# Patient Record
Sex: Male | Born: 1950 | Race: Black or African American | Hispanic: No | Marital: Married | Smoking: Current every day smoker
Health system: Southern US, Community
[De-identification: ages and names within clinical notes are randomized; demographics above are authoritative.]

## PROBLEM LIST (undated history)

## (undated) DIAGNOSIS — M51369 Other intervertebral disc degeneration, lumbar region without mention of lumbar back pain or lower extremity pain: Secondary | ICD-10-CM

## (undated) DIAGNOSIS — F419 Anxiety disorder, unspecified: Secondary | ICD-10-CM

## (undated) DIAGNOSIS — E119 Type 2 diabetes mellitus without complications: Secondary | ICD-10-CM

## (undated) DIAGNOSIS — M5136 Other intervertebral disc degeneration, lumbar region: Secondary | ICD-10-CM

## (undated) DIAGNOSIS — K219 Gastro-esophageal reflux disease without esophagitis: Secondary | ICD-10-CM

## (undated) DIAGNOSIS — C801 Malignant (primary) neoplasm, unspecified: Secondary | ICD-10-CM

## (undated) DIAGNOSIS — E785 Hyperlipidemia, unspecified: Secondary | ICD-10-CM

## (undated) HISTORY — DX: Other intervertebral disc degeneration, lumbar region without mention of lumbar back pain or lower extremity pain: M51.369

## (undated) HISTORY — DX: Gastro-esophageal reflux disease without esophagitis: K21.9

## (undated) HISTORY — DX: Hyperlipidemia, unspecified: E78.5

## (undated) HISTORY — DX: Malignant (primary) neoplasm, unspecified: C80.1

## (undated) HISTORY — DX: Other intervertebral disc degeneration, lumbar region: M51.36

## (undated) HISTORY — DX: Type 2 diabetes mellitus without complications: E11.9

## (undated) HISTORY — DX: Anxiety disorder, unspecified: F41.9

## (undated) HISTORY — PX: PROSTATE SURGERY: SHX751

---

## 1999-10-15 ENCOUNTER — Encounter (INDEPENDENT_AMBULATORY_CARE_PROVIDER_SITE_OTHER): Payer: Self-pay

## 1999-10-15 ENCOUNTER — Other Ambulatory Visit: Admission: RE | Admit: 1999-10-15 | Discharge: 1999-10-15 | Payer: Self-pay | Admitting: Gastroenterology

## 2002-03-23 ENCOUNTER — Encounter: Admission: RE | Admit: 2002-03-23 | Discharge: 2002-03-23 | Payer: Self-pay | Admitting: Family Medicine

## 2002-03-23 ENCOUNTER — Encounter: Payer: Self-pay | Admitting: Family Medicine

## 2003-02-03 ENCOUNTER — Ambulatory Visit (HOSPITAL_COMMUNITY): Admission: RE | Admit: 2003-02-03 | Discharge: 2003-02-03 | Payer: Self-pay | Admitting: Urology

## 2003-02-21 ENCOUNTER — Ambulatory Visit: Admission: RE | Admit: 2003-02-21 | Discharge: 2003-04-26 | Payer: Self-pay | Admitting: Radiation Oncology

## 2003-04-06 ENCOUNTER — Inpatient Hospital Stay (HOSPITAL_COMMUNITY): Admission: RE | Admit: 2003-04-06 | Discharge: 2003-04-09 | Payer: Self-pay | Admitting: Urology

## 2003-04-06 ENCOUNTER — Encounter (INDEPENDENT_AMBULATORY_CARE_PROVIDER_SITE_OTHER): Payer: Self-pay | Admitting: Specialist

## 2004-01-30 ENCOUNTER — Emergency Department (HOSPITAL_COMMUNITY): Admission: EM | Admit: 2004-01-30 | Discharge: 2004-01-30 | Payer: Self-pay | Admitting: Emergency Medicine

## 2005-05-21 ENCOUNTER — Ambulatory Visit: Admission: RE | Admit: 2005-05-21 | Discharge: 2005-08-01 | Payer: Self-pay | Admitting: Radiation Oncology

## 2009-01-20 ENCOUNTER — Encounter (HOSPITAL_COMMUNITY): Admission: RE | Admit: 2009-01-20 | Discharge: 2009-04-19 | Payer: Self-pay | Admitting: Urology

## 2010-05-25 NOTE — Discharge Summary (Signed)
NAMECLEMENS, [name]                        ACCOUNT NO.:  [id number]   MEDICAL RECORD NO.:  [id number]                   PATIENT TYPE:  INP   LOCATION:  0353                                 FACILITY:  [facility]   PHYSICIAN:  Lucrezia Starch. Ovidio Hanger, M.D.           DATE OF BIRTH:  1951/01/04   DATE OF ADMISSION:  04/06/2003  DATE OF DISCHARGE:  04/09/2003                                 DISCHARGE SUMMARY   DIAGNOSIS:  Adenocarcinoma of the prostate.   OPERATIVE PROCEDURE:  Radical retropubic prostatectomy April 06, 2003.   HISTORY:  [name] is a very nice 60 year old black male who presented  with elevated PSA and strong family history of prostate cancer, his PSA was  found to be 6.02, he subsequently underwent transrectal ultrasound and  biopsy of the prostate which revealed a Gleason Score 7 which was 3+4  adenocarcinoma involving cores on both the right and left side, his  metastatic workup has been negative.  After considering options, he has  elected to proceed with radical prostatectomy.   PAST MEDICAL HISTORY, SOCIAL HISTORY, FAMILY HISTORY, REVIEW OF SYSTEMS:  Please see signed patient medical history sheet for full details.   PHYSICAL EXAMINATION:  VITALS:  He is afebrile, vital signs stable.  GENERAL:  Well-nourished, well-developed; no acute distress.  HEAD/EARS/NOSE AND THROAT:  Normal.  NECK:  Without mass or thyromegaly.  CHEST:  Normal.  ABDOMEN:  Soft, nontender, without masses, organomegaly, or hernias.  EXTREMITIES:  Normal.  NEUROLOGICAL:  Intact.  SKIN:  Normal.  GU/RECTAL:  Penis, meatus, scrotum, testicles, adnexa, anus and perineum are  normal.  Rectal vault shows prostate is 30 g, smooth, without nodules.   HOSPITAL COURSE:  Patient was admitted after undergoing proper preoperative  evaluation and was subsequently taken to surgery on April 06, 2003 and  underwent radical retropubic prostatectomy uneventfully.  In the immediate  postoperative period he  was comfortable, urine was clear, and abdomen was  soft.  On postop day #1 which was April 07, 2003 hemoglobin was 10.9,  hematocrit 33.3, white cell count was 14.6, BMET was essentially normal.  He  was comfortable and beginning to take fluids.  He had some soft abdominal  distention but was comfortable.  By April 08, 2003 I&O was adequate, he was  tolerating liquids without flatus or bowel movements, chest was clear,  abdomen soft, Blake output was variable but low and the morphine PCA and  Toradol were discontinued.  He subsequently improved and by April 09, 2003  was tolerating a diet, a bowel movement was noted, abdomen was slightly  distended but less so and nontender, Blake output was low so it was  discontinued, wound was clean and dry  and he was discharged, it was felt the patient was improved.  Discharge  medications included Levaquin, Vicodin, and aspirin.  He was to follow up in  the next week for staple removal.  Specific instructions  were given.  Pathology was pending at that time.                                               Ronald L. Ovidio Hanger, M.D.    RLD/MEDQ  D:  04/13/2003  T:  04/14/2003  Job:  [id number]

## 2010-05-25 NOTE — H&P (Signed)
[name], [name]                        ACCOUNT NO.:  [id number]   MEDICAL RECORD NO.:  [id number]                   PATIENT TYPE:  INP   LOCATION:  0353                                 FACILITY:  [facility]   PHYSICIAN:  [name], M.D.               DATE OF BIRTH:  1950-11-18   DATE OF ADMISSION:  04/06/2003  DATE OF DISCHARGE:                                HISTORY & PHYSICAL   CHIEF COMPLAINT:  Prostate cancer.   HISTORY OF PRESENT ILLNESS:  [name] is a pleasant 60 year old, African-  American male, who has been followed in the past for an elevated PSA.  In  addition, he has a strong family history of prostate cancer with prostate  cancer in his father and his brothers.  On a routine PSA check, his PSA  level was found to be 6.02.  Subsequently, he underwent a transrectal  ultrasound-guided prostate biopsy which revealed a Gleason 3 + 4 = 7  adenocarcinoma of the prostate involving cores on both the right and the  left.  A CT scan was then performed with and without contrast which showed  no evidence of metastatic disease or lymphadenopathy.  The patient was  subsequently evaluated by radiation oncology, and several methods of therapy  were discussed including radiation seed implantation, cryosurgery, watchful  waiting, and radical retropubic prostatectomy.  The patient has elected to  proceed with surgical prostatectomy for definitive management of his  prostate cancer.  He is admitted at this time to undergo a radical  retropubic prostatectomy with bilateral pelvic lymph node dissection.   PAST MEDICAL HISTORY:  1. Anxiety.  2. Hypercholesterolemia.  3. Gastroesophageal reflux disease.   PAST SURGICAL HISTORY:  None.   FAMILY HISTORY:  As previously mentioned, positive for prostate cancer in  the patient's brothers as well as his father.   SOCIAL HISTORY:  The patient is a 10-pack-year smoker.  He denies any  alcohol or illicit drug abuse.   REVIEW OF  SYSTEMS:  Positive for occasional headaches as well as fatigue.  Also positive is occasional shortness of breath, urinary frequency, and  urinary urgency.  The remainder of review of systems were reviewed and are  negative.   MEDICATIONS:  1. Xanax 0.25 mg t.i.d.  2. Paxil CR 12.5 mg daily.  3. Lipitor 20 mg p.o. daily.  4. Aciphex 20 mg p.o. daily.  5. Bayer aspirin 81 mg p.o. daily.   ALLERGIES:  No known drug allergies.   PHYSICAL EXAMINATION:  VITAL SIGNS:  Blood pressure 125/75, heart rate 73,  respirations 20, temperature 98.1.  GENERAL:  Well-developed, well-nourished, in no apparent distress.  HEENT:  Normocephalic, atraumatic.  Pupils equal, round, and reactive to  light and accommodation.  Extraocular movements are intact.  Nares are  patent.  The oropharynx is clear.  NECK:  Supple.  No lymphadenopathy.  There is no JVD.  CARDIOVASCULAR:  Regular rate  and rhythm.  No murmurs, gallops, or rubs.  PULMONARY:  Clear to auscultation bilaterally.  No wheezes, crackles, or  rhonchi.  ABDOMEN:  Soft, nontender, nondistended with positive bowel sounds.  No  hepatosplenomegaly.  BACK:  No CVA tenderness bilaterally.  EXTREMITIES:  No cyanosis, clubbing, or edema.  NEUROLOGIC:  Cranial nerves 2-12 intact.  Sensation is grossly nonfocal.  Motor is 5/5 throughout.  SKIN:  Integument intact.  GU:  Normal-appearing, circumcised male phallus.  There are no palpable  penile plaques, and the urethral meatus is normal without discharge.  The  scrotum is normal in appearance.  The testicles are bilaterally distended  without evidence of masses or nodules.  DIGITAL RECTAL EXAM:  Good sphincter tone.  The prostate is approximately 30  g, smooth, symmetric without nodules.   IMPRESSION:  This is a very pleasant 60 year old African-American male found  to have a Gleason 3 + 4 = 7 adenocarcinoma of the prostate bilaterally with  a pretreatment PSA of 6.02.  In addition, he has a very strong  family  history of prostate cancer as well as some lower urinary tract symptoms  including frequency and urgency.  After discussing several modalities of  definitive treatment, the patient has elected to proceed with radical  retropubic prostatectomy.   PLAN:  1. The patient will undergo radical retropubic prostatectomy with bilateral     pelvic lymph node dissection.  2. He will be admitted to the floor postoperatively for observation and     advancement of diet prior to discharge to home.     Tracy Breed, MD                            [name], M.D.    EG/MEDQ  D:  04/06/2003  T:  04/06/2003  Job:  [id number]

## 2010-05-25 NOTE — Op Note (Signed)
NAMEJACHAI, [name]                        ACCOUNT NO.:  [id number]   MEDICAL RECORD NO.:  [id number]                   PATIENT TYPE:  INP   LOCATION:  0353                                 FACILITY:  [facility]   PHYSICIAN:  Lucrezia Starch. Earlene Plater, M.D.               DATE OF BIRTH:  11/05/1950   DATE OF PROCEDURE:  04/06/2003  DATE OF DISCHARGE:                                 OPERATIVE REPORT   PREOPERATIVE DIAGNOSIS:  Prostate cancer.   POSTOPERATIVE DIAGNOSIS:  Prostate cancer.   PROCEDURE:  Nerve sparing radical retropubic prostatectomy with bilateral  pelvic lymph node dissection.   SURGEON:  Lucrezia Starch. Earlene Plater, MD   ASSISTANTS:  1. Larey Dresser, MD  2. Thyra Breed, MD   ANESTHESIA:  General endotracheal.   ESTIMATED BLOOD LOSS:  550 mL.   DRAINS:  A 22 French Foley catheter to straight drain and a 10 [place] Blake  drain to bulb suction.   COMPLICATIONS:  None.   INDICATIONS FOR PROCEDURE:  [name] is a very pleasant, 60 year old  African-American male, who underwent a transrectal ultrasound-guided  prostate biopsy for an elevated PSA of 6.02.  His pathology returned with a  Gleason 3 + 4 = 7 adenocarcinoma of the prostate involving both sides of the  prostate.  In addition, he has a strong family history of prostate cancer in  his father as well as his brothers.  Additionally, he has some lower urinary  tract symptoms, including urinary urgency and frequency.  After exploring  all modalities of treatment for his prostate cancer, he has elected to  proceed with surgery.  Therefore, the risks, benefits, and alternatives of  nerve sparing radical retropubic prostatectomy have been explained to the  patient.  He understands and is willing to proceed.   PROCEDURE IN DETAIL:  Following identification by his arm bracelet, the  patient was brought to the operating room and placed in the supine position.  He then underwent general endotracheal anesthesia and received  preoperative  IV antibiotics.  The lower abdomen and genitalia were then shaved, prepped  with Betadine, and draped in the usual sterile fashion.  A 24 French Foley  catheter was then inserted into the bladder and the bladder drained.  A  lower midline incision was then made using the scalpel from just below the  umbilicus to the level of the pubis.  The incision was carried down through  the subcutaneous tissues to the anterior rectus fascia using Bovie  electrocautery.  Any subcutaneous bleeding was controlled using the Bovie.  The fascia was then divided using the Bovie and the rectus muscle  identified.  The midline was then isolated and the rectus muscle divided by  blunt dissection.  The transversalis fascia was then opened and blunt  dissection was used to expose both the right and the left pelvic fossa.  The  Bookwalter retractor was then inserted, and two body wall  blades were used  to provide maximum exposure of the operative field.  The right pelvic fossa  was then exposed with the addition of malleable retractor.  Right-sided  lymph node dissection was then performed using Metzenbaum scissors and  DeBakey forceps.  The limits of the node dissection included the external  iliac vein, the obturator nerve, the circumflex iliac vein to the level of  the bifurcation of the iliac artery.  Large clips were used to control any  vascular and lymphatic channels.  The obturator nerve was in direct site and  protected during the entire procedure.  There was a rather large nodal  packet on the right but no obvious gross nodal disease.  We then turned our  attention after shifting retractors to the left pelvic fossa.  The left-  sided node dissection was then carried out in a similar manner, again  controlling all lymphatic vascular channels with large clips.  Once the  nodal dissections were complete and there was no obvious gross nodal  disease, we began our dissection of the prostate.  It  should be noted that  the pelvis was rather narrow, and the ensuing dissection of the prostate was  somewhat difficult.  Upon beginning our dissection of the prostate, it was  quite obvious that the patient had a somewhat narrow pelvis with a difficult  to visualize prostate.  Initially, Metzenbaum scissors were used to puncture  the endopelvic fascia on both the right and the left lateral aspects of the  prostate.  A right-angle clamp was then inserted through the opening and the  lateral aspect of the prostate was freed using Bovie electrocautery.  The  fascia was then incised superiorly as it reflected over the prostate.  Fat  overlying the dorsal vein complex was then gently moved away to narrow the  area.  The puboprostatic ligaments were then taken down from their  attachments to the pubis laterally.  The McDougall clamp was then placed  just superior to the urethra beneath the dorsal vein complex.  A #1 Vicryl  tie was then passed and used to ligate the dorsal vein complex.  A second #1  Vicryl tie was also used to doubly ligate this complex.  We then utilized a  2-0 Vicryl suture on a UR-5 needle to prevent backbleeding.  The dorsal vein  complex was then divided exposing the apex of the prostate and the urethra.  At this time, a second 2-0 Vicryl suture was used in a running fashion to  prevent any further backbleeding from the dorsal vein complex.  We then  carefully moved the neurovascular bundles from the urethra laterally,  spreading with Metzenbaum scissors in a vertical fashion in between the  urethra and the neurovascular bundles.  The McDougall clamp was then again  utilized and passed beneath the urethra.  A moistened umbilical tape was  passed and a hemostat affixed it from the operative field.  The anterior  urethra was then divided sharply with Metzenbaum scissors at the apex of the  prostate.  The Foley catheter was then lubricated and pulled into the wound. The  catheter was divided and used to provide traction on the prostate during  further dissection.  The posterior urethra was then divided and all  rectourethralis attachments were taken down bluntly.  The remainder of the  prostate was then dissected off the rectum bluntly.  The lateral pedicles  were then taken down using right-angle dissection and large right-angle Hem-  o-Lok  clips.  Once the prostate was sufficiently elevated, we turned our  attention anteriorly to the location of the bladder neck.  The Bovie was  then used to begin dissection of the bladder neck fibers.  The anterior  bladder neck was then divided with Metzenbaum scissors.  The Foley catheter  balloon was then deflated and the catheter brought from the bladder and used  to provide traction on the prostate.  At this time, both ureteral orifices  were identified and seen to be effluxing blue urine from earlier  administration of indigo carmine.  The posterior bladder neck was then  divided along with any remaining posterior prostatic attachments.  We  continued this dissection until the seminal vesicles were isolated.  Both  ampulla of the vas were then identified, dissected out, and ligated using  large clips.  Similarly, the seminal vesicles were dissected out and ligated  using large clips.  The prostate was then free and passed from the surgical  field for pathologic evaluation.   At this time, any remaining bleeding was controlled with Bovie  electrocautery.  Once excellent hemostasis was obtained, we turned our  attention to the bladder neck.  The bladder neck was quite nice in  appearance without requiring any reconstruction.  The bladder neck just fit  the surgeon's fifth finger.  We then used interrupted 4-0 chromic suture to  evert the bladder mucosa and mature the bladder neck prior to anastomosis to  the urethral stump.  Care was taken throughout this portion of the procedure  to avoid the ureteral orifices.  At  this point, a new 22 [place] Foley  catheter was inserted into the penis and DeBakey forceps used to retrieve it  from its exit through the urethral stump.  At this point, the anastomotic  sutures were placed; 2-0 Vicryl interrupted sutures were then placed in the  following order:  Beginning with the bladder from outside-in to inside-out  on the urethral stump, interrupted sutures were placed in the 5 o'clock, 7  o'clock, 10 o'clock, and 2 o'clock positions.  The Foley catheter was then  inserted into the bladder and approximately 15 mL used to fill the balloon.  At this time, the 12 o'clock anastomotic suture was placed with a 2-0 Vicryl  suture.  At this time, the anastomotic sutures were then secured into  position.  However, upon tying down the first anastomotic suture, the Foley  catheter balloon was found to be outside of the bladder.  The posterior  portion of the bladder neck was seen to be weak and allowed the Foley catheter balloon to pull through the bladder neck during this portion of the  procedure.  Therefore, the Foley catheter was removed; all anastomotic  sutures were removed, and we reconstructed the bladder neck in a tennis  racquet fashion using a 3-0 interrupted chromic suture.  Two additional  interrupted 4-0 chromic sutures were then used to complete bladder neck  eversion.  Again, 2-0 Vicryl sutures were placed in the 5, 7, 2, 10, and 12  o'clock positions as previously stated.  A new Foley catheter balloon was  inserted into the bladder.  It should be mentioned that a 1-0 Prolene suture  was affixed to the end of the Foley catheter and carefully brought through  the dome of the bladder for further fixation to the right abdominal wall.  All anastomotic sutures were then secured after removing all flecks from the  table and loosening all Bookwalter retractors.  The Foley catheter was then  irrigated, and the anastomosis was seen to be watertight.  The wound was  then  copiously irrigated.  The previously mentioned 1-0 PDS exiting the  bladder was then passed through the right anterior lateral abdominal wall,  well lateral to the incision and secured to a button.  On the left abdominal  wall lateral to the incision, a #10 flat fluted Blake drain was placed  through a separate stab wound and placed in the vicinity of the anastomosis.  One final check revealed excellent hemostasis and an intact anastomosis.  A  #1 running PDS suture was then used to close the fascia.  The rectus muscles  were then reapproximated with two interrupted 2-0 chromic sutures.  A #1  running PDS suture was then used to close the fascia.  The subcutaneous  tissues were then irrigated with sterile saline.  Following closure of the  fascia, the skin was then closed with surgical clips.  The Foley catheter  was again irrigated.  There was immediate return of pink-tinged urine.  All  instrument and needle counts were correct x 2.  This marked the termination  of the procedure.  The patient tolerated the procedure well, and there were  no complications.  Please note that Dr. Gaynelle Arabian was present and  participated in the entire procedure as he was the responsible surgeon.   DISPOSITION:  After awaking from general anesthesia, the patient was  transported to the postanesthesia care unit in stable condition.  From here,  he would be transferred to the floor for postoperative care.     Thyra Breed, MD                            Lucrezia Starch. Earlene Plater, M.D.    EG/MEDQ  D:  04/06/2003  T:  04/06/2003  Job:  [id number]

## 2011-10-21 ENCOUNTER — Encounter: Payer: Self-pay | Admitting: Gastroenterology

## 2012-05-11 ENCOUNTER — Encounter: Payer: Self-pay | Admitting: Gastroenterology

## 2012-10-28 ENCOUNTER — Other Ambulatory Visit: Payer: Self-pay | Admitting: Physician Assistant

## 2012-10-28 ENCOUNTER — Ambulatory Visit
Admission: RE | Admit: 2012-10-28 | Discharge: 2012-10-28 | Disposition: A | Discharge status: Home / Self Care | Payer: PRIVATE HEALTH INSURANCE | Source: Ambulatory Visit | Attending: Physician Assistant | Admitting: Physician Assistant

## 2012-10-28 DIAGNOSIS — F17200 Nicotine dependence, unspecified, uncomplicated: Secondary | ICD-10-CM

## 2012-10-28 DIAGNOSIS — R05 Cough: Secondary | ICD-10-CM

## 2013-10-30 ENCOUNTER — Ambulatory Visit (INDEPENDENT_AMBULATORY_CARE_PROVIDER_SITE_OTHER): Payer: Managed Care, Other (non HMO) | Admitting: Family Medicine

## 2013-10-30 VITALS — BP 130/74 | HR 79 | Temp 98.2°F | Resp 19 | Ht 67.5 in | Wt 204.8 lb

## 2013-10-30 DIAGNOSIS — R05 Cough: Secondary | ICD-10-CM

## 2013-10-30 DIAGNOSIS — J309 Allergic rhinitis, unspecified: Secondary | ICD-10-CM

## 2013-10-30 DIAGNOSIS — J3489 Other specified disorders of nose and nasal sinuses: Secondary | ICD-10-CM

## 2013-10-30 DIAGNOSIS — R059 Cough, unspecified: Secondary | ICD-10-CM

## 2013-10-30 DIAGNOSIS — J069 Acute upper respiratory infection, unspecified: Secondary | ICD-10-CM

## 2013-10-30 MED ORDER — AMOXICILLIN-POT CLAVULANATE 875-125 MG PO TABS: 1.0000 | ORAL_TABLET | Freq: Two times a day (BID) | ORAL | Status: DC

## 2013-10-30 NOTE — Patient Instructions (Signed)
Your symptoms appear to be due to likely cold virus or allergies at this time. Saline nasal spray atleast 4 times per day, over the counter mucinex or mucinex for cough. Avoid decongestants. drink plenty of fluids.  If your sinus symptoms are not improving next week, can fill antibiotic for sinus infection. Ok to start flonase nasal spray over the counter if needed for allergies.  Return to the clinic or go to the nearest emergency room if any of your symptoms worsen or new symptoms occur.  Upper Respiratory Infection, Adult An upper respiratory infection (URI) is also sometimes known as the common cold. The upper respiratory tract includes the nose, sinuses, throat, trachea, and bronchi. Bronchi are the airways leading to the lungs. Most people improve within 1 week, but symptoms can last up to 2 weeks. A residual cough may last even longer.  CAUSES Many different viruses can infect the tissues lining the upper respiratory tract. The tissues become irritated and inflamed and often become very moist. Mucus production is also common. A cold is contagious. You can easily spread the virus to others by oral contact. This includes kissing, sharing a glass, coughing, or sneezing. Touching your mouth or nose and then touching a surface, which is then touched by another person, can also spread the virus. SYMPTOMS  Symptoms typically develop 1 to 3 days after you come in contact with a cold virus. Symptoms vary from person to person. They may include:  Runny nose.  Sneezing.  Nasal congestion.  Sinus irritation.  Sore throat.  Loss of voice (laryngitis).  Cough.  Fatigue.  Muscle aches.  Loss of appetite.  Headache.  Low-grade fever. DIAGNOSIS  You might diagnose your own cold based on familiar symptoms, since most people get a cold 2 to 3 times a year. Your caregiver can confirm this based on your exam. Most importantly, your caregiver can check that your symptoms are not due to another  disease such as strep throat, sinusitis, pneumonia, asthma, or epiglottitis. Blood tests, throat tests, and X-rays are not necessary to diagnose a common cold, but they may sometimes be helpful in excluding other more serious diseases. Your caregiver will decide if any further tests are required. RISKS AND COMPLICATIONS  You may be at risk for a more severe case of the common cold if you smoke cigarettes, have chronic heart disease (such as heart failure) or lung disease (such as asthma), or if you have a weakened immune system. The very young and very old are also at risk for more serious infections. Bacterial sinusitis, middle ear infections, and bacterial pneumonia can complicate the common cold. The common cold can worsen asthma and chronic obstructive pulmonary disease (COPD). Sometimes, these complications can require emergency medical care and may be life-threatening. PREVENTION  The best way to protect against getting a cold is to practice good hygiene. Avoid oral or hand contact with people with cold symptoms. Wash your hands often if contact occurs. There is no clear evidence that vitamin C, vitamin E, echinacea, or exercise reduces the chance of developing a cold. However, it is always recommended to get plenty of rest and practice good nutrition. TREATMENT  Treatment is directed at relieving symptoms. There is no cure. Antibiotics are not effective, because the infection is caused by a virus, not by bacteria. Treatment may include:  Increased fluid intake. Sports drinks offer valuable electrolytes, sugars, and fluids.  Breathing heated mist or steam (vaporizer or shower).  Eating chicken soup or other clear broths,  and maintaining good nutrition.  Getting plenty of rest.  Using gargles or lozenges for comfort.  Controlling fevers with ibuprofen or acetaminophen as directed by your caregiver.  Increasing usage of your inhaler if you have asthma. Zinc gel and zinc lozenges, taken in  the first 24 hours of the common cold, can shorten the duration and lessen the severity of symptoms. Pain medicines may help with fever, muscle aches, and throat pain. A variety of non-prescription medicines are available to treat congestion and runny nose. Your caregiver can make recommendations and may suggest nasal or lung inhalers for other symptoms.  HOME CARE INSTRUCTIONS   Only take over-the-counter or prescription medicines for pain, discomfort, or fever as directed by your caregiver.  Use a warm mist humidifier or inhale steam from a shower to increase air moisture. This may keep secretions moist and make it easier to breathe.  Drink enough water and fluids to keep your urine clear or pale yellow.  Rest as needed.  Return to work when your temperature has returned to normal or as your caregiver advises. You may need to stay home longer to avoid infecting others. You can also use a face mask and careful hand washing to prevent spread of the virus. SEEK MEDICAL CARE IF:   After the first few days, you feel you are getting worse rather than better.  You need your caregiver's advice about medicines to control symptoms.  You develop chills, worsening shortness of breath, or brown or red sputum. These may be signs of pneumonia.  You develop yellow or brown nasal discharge or pain in the face, especially when you bend forward. These may be signs of sinusitis.  You develop a fever, swollen neck glands, pain with swallowing, or white areas in the back of your throat. These may be signs of strep throat. SEEK IMMEDIATE MEDICAL CARE IF:   You have a fever.  You develop severe or persistent headache, ear pain, sinus pain, or chest pain.  You develop wheezing, a prolonged cough, cough up blood, or have a change in your usual mucus (if you have chronic lung disease).  You develop sore muscles or a stiff neck. Document Released: 06/19/2000 Document Revised: 03/18/2011 Document Reviewed:  03/31/2013 [facility] Patient Information 2015 [place], [place]. This information is not intended to replace advice given to you by your health care provider. Make sure you discuss any questions you have with your health care provider.

## 2013-10-30 NOTE — Progress Notes (Signed)
Subjective:    Patient ID: [name], male    DOB: 1950/05/03, 63 y.o.   MRN: [id number]  Headache  Associated symptoms include coughing and rhinorrhea. Pertinent negatives include no fever or sinus pressure.  Sore Throat  Associated symptoms include congestion, coughing, headaches and a plugged ear sensation. Pertinent negatives include no shortness of breath.  Ear Fullness  Associated symptoms include coughing, headaches and rhinorrhea.   Chief Complaint  Patient presents with  . Nasal Congestion    clear drain, x 2 days  . Headache    facial pressure  . Sore Throat  . Ear Fullness   This chart was scribed for Merri Ray, MD by Thea Alken, ED Scribe. This patient was seen in room 13 and the patient's care was started at 10:44 AM.  HPI Comments: [name] is a 63 y.o. male who presents to the [facility] complaining of nasal congestion, HA and facial pressure. Pt reports symptoms began 3 days ago with sinus drainage and a scratchy throat that progressively worsened over night with HA, upper face tightness, diaphoresis, sneezing, watery eyes and a  productive cough consisting of clear mucous. Pt has tried tylenol cold and sinus and took a mucinex last night without relief.Pt reports similar symptoms about 1 year ago where he received 2 rounds. Pt takes certirizine for allergies.  Pt denies sick contacts. Pt denies SOB and fever.    There are no active problems to display for this patient.  Past Medical History  Diagnosis Date  . Anxiety   . Cancer    Past Surgical History  Procedure Laterality Date  . Prostate surgery     No Known Allergies Prior to Admission medications   Medication Sig Start Date End Date Taking? Authorizing Provider  ALPRAZolam Duanne Moron) 0.5 MG tablet Take 0.5 mg by mouth at bedtime as needed for anxiety.   Yes Historical Provider, MD  aspirin 81 MG tablet Take 81 mg by mouth daily.   Yes Historical Provider, MD    atorvastatin (LIPITOR) 10 MG tablet Take 10 mg by mouth daily.   Yes Historical Provider, MD  cetirizine (ZYRTEC) 10 MG tablet Take 10 mg by mouth daily.   Yes Historical Provider, MD  cyclobenzaprine (FLEXERIL) 10 MG tablet Take 10 mg by mouth 3 (three) times daily as needed for muscle spasms.   Yes Historical Provider, MD  enalapril (VASOTEC) 2.5 MG tablet Take 2.5 mg by mouth daily.   Yes Historical Provider, MD  metFORMIN (GLUCOPHAGE) 500 MG tablet Take 500 mg by mouth 2 (two) times daily with a meal.   Yes Historical Provider, MD  RABEprazole (ACIPHEX) 20 MG tablet Take 20 mg by mouth daily.   Yes Historical Provider, MD   History   Social History  . Marital Status: Married    Spouse Name: N/A    Number of Children: N/A  . Years of Education: N/A   Occupational History  . Not on file.   Social History Main Topics  . Smoking status: Current Every Day Smoker  . Smokeless tobacco: Never Used  . Alcohol Use: No  . Drug Use: No  . Sexual Activity: Not on file   Other Topics Concern  . Not on file   Social History Narrative  . No narrative on file   Review of Systems  Constitutional: Positive for diaphoresis. Negative for fever and chills.  HENT: Positive for congestion, rhinorrhea and sneezing. Negative for sinus pressure.  Eyes: Positive for discharge.  Respiratory: Positive for cough. Negative for shortness of breath.   Neurological: Positive for headaches.    Objective:   Physical Exam  Vitals reviewed. Constitutional: He is oriented to person, place, and time. He appears well-developed and well-nourished.  HENT:  Head: Normocephalic and atraumatic.  Right Ear: Tympanic membrane, external ear and ear canal normal.  Left Ear: Tympanic membrane, external ear and ear canal normal.  Nose: No rhinorrhea. Right sinus exhibits no maxillary sinus tenderness and no frontal sinus tenderness. Left sinus exhibits no maxillary sinus tenderness and no frontal sinus tenderness.   Mouth/Throat: Oropharynx is clear and moist and mucous membranes are normal. No oropharyngeal exudate or posterior oropharyngeal erythema.  TM's obstructed by cerumen but canals are clear otherwise.  Eyes: Conjunctivae are normal. Pupils are equal, round, and reactive to light.  Neck: Neck supple.  Cardiovascular: Regular rhythm, normal heart sounds and intact distal pulses.   No murmur heard. Rear ectopic beat other wise rhythm normal  Pulmonary/Chest: Effort normal and breath sounds normal. He has no decreased breath sounds. He has no wheezes. He has no rhonchi. He has no rales.  Abdominal: Soft. There is no tenderness.  Lymphadenopathy:    He has no cervical adenopathy.  Neurological: He is alert and oriented to person, place, and time.  Skin: Skin is warm and dry. No rash noted.  Psychiatric: He has a normal mood and affect. His behavior is normal.   Filed Vitals:   10/30/13 1007  BP: 130/74  Pulse: 79  Temp: 98.2 F (36.8 C)  TempSrc: Oral  Resp: 19  Height: 5' 7.5" (1.715 m)  Weight: 204 lb 12.8 oz (92.897 kg)  SpO2: 97%    Assessment & Plan:   [name] is a 64 y.o. male Acute upper respiratory infection - Plan: amoxicillin-clavulanate (AUGMENTIN) 875-125 MG per tablet  Allergic rhinitis, unspecified allergic rhinitis type  Cough  Sinus pressure - Plan: amoxicillin-clavulanate (AUGMENTIN) 875-125 MG per tablet  Suspected viral URI, +/- allergy component. Discussed likely not bacterial process at this time, even with sinus pressure. Sx care for now per avs, rtc precautions and if sinus sx's not improving next week - printed Rx augmentin to fill if needed. Rtc/er precautions.   Meds ordered this encounter  Medications  . metFORMIN (GLUCOPHAGE) 500 MG tablet    Sig: Take 500 mg by mouth 2 (two) times daily with a meal.  . RABEprazole (ACIPHEX) 20 MG tablet    Sig: Take 20 mg by mouth daily.  Marland Kitchen atorvastatin (LIPITOR) 10 MG tablet    Sig: Take 10 mg by mouth  daily.  . cetirizine (ZYRTEC) 10 MG tablet    Sig: Take 10 mg by mouth daily.  Marland Kitchen ALPRAZolam (XANAX) 0.5 MG tablet    Sig: Take 0.5 mg by mouth at bedtime as needed for anxiety.  Marland Kitchen aspirin 81 MG tablet    Sig: Take 81 mg by mouth daily.  . enalapril (VASOTEC) 2.5 MG tablet    Sig: Take 2.5 mg by mouth daily.  . cyclobenzaprine (FLEXERIL) 10 MG tablet    Sig: Take 10 mg by mouth 3 (three) times daily as needed for muscle spasms.  Marland Kitchen amoxicillin-clavulanate (AUGMENTIN) 875-125 MG per tablet    Sig: Take 1 tablet by mouth 2 (two) times daily.    Dispense:  20 tablet    Refill:  0   Patient Instructions  Your symptoms appear to be due to likely cold virus or allergies  at this time. Saline nasal spray atleast 4 times per day, over the counter mucinex or mucinex for cough. Avoid decongestants. drink plenty of fluids.  If your sinus symptoms are not improving next week, can fill antibiotic for sinus infection. Ok to start flonase nasal spray over the counter if needed for allergies.  Return to the clinic or go to the nearest emergency room if any of your symptoms worsen or new symptoms occur.  Upper Respiratory Infection, Adult An upper respiratory infection (URI) is also sometimes known as the common cold. The upper respiratory tract includes the nose, sinuses, throat, trachea, and bronchi. Bronchi are the airways leading to the lungs. Most people improve within 1 week, but symptoms can last up to 2 weeks. A residual cough may last even longer.  CAUSES Many different viruses can infect the tissues lining the upper respiratory tract. The tissues become irritated and inflamed and often become very moist. Mucus production is also common. A cold is contagious. You can easily spread the virus to others by oral contact. This includes kissing, sharing a glass, coughing, or sneezing. Touching your mouth or nose and then touching a surface, which is then touched by another person, can also spread the  virus. SYMPTOMS  Symptoms typically develop 1 to 3 days after you come in contact with a cold virus. Symptoms vary from person to person. They may include:  Runny nose.  Sneezing.  Nasal congestion.  Sinus irritation.  Sore throat.  Loss of voice (laryngitis).  Cough.  Fatigue.  Muscle aches.  Loss of appetite.  Headache.  Low-grade fever. DIAGNOSIS  You might diagnose your own cold based on familiar symptoms, since most people get a cold 2 to 3 times a year. Your caregiver can confirm this based on your exam. Most importantly, your caregiver can check that your symptoms are not due to another disease such as strep throat, sinusitis, pneumonia, asthma, or epiglottitis. Blood tests, throat tests, and X-rays are not necessary to diagnose a common cold, but they may sometimes be helpful in excluding other more serious diseases. Your caregiver will decide if any further tests are required. RISKS AND COMPLICATIONS  You may be at risk for a more severe case of the common cold if you smoke cigarettes, have chronic heart disease (such as heart failure) or lung disease (such as asthma), or if you have a weakened immune system. The very young and very old are also at risk for more serious infections. Bacterial sinusitis, middle ear infections, and bacterial pneumonia can complicate the common cold. The common cold can worsen asthma and chronic obstructive pulmonary disease (COPD). Sometimes, these complications can require emergency medical care and may be life-threatening. PREVENTION  The best way to protect against getting a cold is to practice good hygiene. Avoid oral or hand contact with people with cold symptoms. Wash your hands often if contact occurs. There is no clear evidence that vitamin C, vitamin E, echinacea, or exercise reduces the chance of developing a cold. However, it is always recommended to get plenty of rest and practice good nutrition. TREATMENT  Treatment is directed at  relieving symptoms. There is no cure. Antibiotics are not effective, because the infection is caused by a virus, not by bacteria. Treatment may include:  Increased fluid intake. Sports drinks offer valuable electrolytes, sugars, and fluids.  Breathing heated mist or steam (vaporizer or shower).  Eating chicken soup or other clear broths, and maintaining good nutrition.  Getting plenty of rest.  Using  gargles or lozenges for comfort.  Controlling fevers with ibuprofen or acetaminophen as directed by your caregiver.  Increasing usage of your inhaler if you have asthma. Zinc gel and zinc lozenges, taken in the first 24 hours of the common cold, can shorten the duration and lessen the severity of symptoms. Pain medicines may help with fever, muscle aches, and throat pain. A variety of non-prescription medicines are available to treat congestion and runny nose. Your caregiver can make recommendations and may suggest nasal or lung inhalers for other symptoms.  HOME CARE INSTRUCTIONS   Only take over-the-counter or prescription medicines for pain, discomfort, or fever as directed by your caregiver.  Use a warm mist humidifier or inhale steam from a shower to increase air moisture. This may keep secretions moist and make it easier to breathe.  Drink enough water and fluids to keep your urine clear or pale yellow.  Rest as needed.  Return to work when your temperature has returned to normal or as your caregiver advises. You may need to stay home longer to avoid infecting others. You can also use a face mask and careful hand washing to prevent spread of the virus. SEEK MEDICAL CARE IF:   After the first few days, you feel you are getting worse rather than better.  You need your caregiver's advice about medicines to control symptoms.  You develop chills, worsening shortness of breath, or brown or red sputum. These may be signs of pneumonia.  You develop yellow or brown nasal discharge or pain  in the face, especially when you bend forward. These may be signs of sinusitis.  You develop a fever, swollen neck glands, pain with swallowing, or white areas in the back of your throat. These may be signs of strep throat. SEEK IMMEDIATE MEDICAL CARE IF:   You have a fever.  You develop severe or persistent headache, ear pain, sinus pain, or chest pain.  You develop wheezing, a prolonged cough, cough up blood, or have a change in your usual mucus (if you have chronic lung disease).  You develop sore muscles or a stiff neck. Document Released: 06/19/2000 Document Revised: 03/18/2011 Document Reviewed: 03/31/2013 [facility] Patient Information 2015 [place], [place]. This information is not intended to replace advice given to you by your health care provider. Make sure you discuss any questions you have with your health care provider.     I personally performed the services described in this documentation, which was scribed in my presence. The recorded information has been reviewed and considered, and addended by me as needed.

## 2014-01-06 DIAGNOSIS — IMO0002 Reserved for concepts with insufficient information to code with codable children: Secondary | ICD-10-CM | POA: Insufficient documentation

## 2014-01-06 DIAGNOSIS — E1165 Type 2 diabetes mellitus with hyperglycemia: Secondary | ICD-10-CM

## 2014-01-06 DIAGNOSIS — E785 Hyperlipidemia, unspecified: Secondary | ICD-10-CM

## 2014-01-06 DIAGNOSIS — E782 Mixed hyperlipidemia: Secondary | ICD-10-CM | POA: Insufficient documentation

## 2014-01-13 ENCOUNTER — Encounter: Payer: Self-pay | Admitting: *Deleted

## 2017-04-17 ENCOUNTER — Emergency Department (HOSPITAL_COMMUNITY)
Admission: EM | Admit: 2017-04-17 | Discharge: 2017-04-17 | Disposition: A | Discharge status: Home / Self Care | Payer: Medicare Other | Attending: Emergency Medicine | Admitting: Emergency Medicine

## 2017-04-17 ENCOUNTER — Encounter (HOSPITAL_COMMUNITY): Payer: Self-pay | Admitting: Emergency Medicine

## 2017-04-17 DIAGNOSIS — Z79899 Other long term (current) drug therapy: Secondary | ICD-10-CM | POA: Diagnosis not present

## 2017-04-17 DIAGNOSIS — M79602 Pain in left arm: Secondary | ICD-10-CM | POA: Diagnosis present

## 2017-04-17 DIAGNOSIS — F17200 Nicotine dependence, unspecified, uncomplicated: Secondary | ICD-10-CM | POA: Insufficient documentation

## 2017-04-17 DIAGNOSIS — E119 Type 2 diabetes mellitus without complications: Secondary | ICD-10-CM | POA: Diagnosis not present

## 2017-04-17 DIAGNOSIS — Z7984 Long term (current) use of oral hypoglycemic drugs: Secondary | ICD-10-CM | POA: Insufficient documentation

## 2017-04-17 DIAGNOSIS — K219 Gastro-esophageal reflux disease without esophagitis: Secondary | ICD-10-CM | POA: Diagnosis not present

## 2017-04-17 DIAGNOSIS — Z7982 Long term (current) use of aspirin: Secondary | ICD-10-CM | POA: Insufficient documentation

## 2017-04-17 LAB — CBC WITH DIFFERENTIAL/PLATELET
BASOS ABS: 0 10*3/uL (ref 0.0–0.1)
BASOS PCT: 0 %
EOS PCT: 2 %
Eosinophils Absolute: 0.2 10*3/uL (ref 0.0–0.7)
HCT: 40.6 % (ref 39.0–52.0)
Hemoglobin: 13.3 g/dL (ref 13.0–17.0)
Lymphocytes Relative: 44 %
Lymphs Abs: 3.4 10*3/uL (ref 0.7–4.0)
MCH: 27.4 pg (ref 26.0–34.0)
MCHC: 32.8 g/dL (ref 30.0–36.0)
MCV: 83.7 fL (ref 78.0–100.0)
MONO ABS: 0.5 10*3/uL (ref 0.1–1.0)
Monocytes Relative: 7 %
Neutro Abs: 3.8 10*3/uL (ref 1.7–7.7)
Neutrophils Relative %: 47 %
PLATELETS: 253 10*3/uL (ref 150–400)
RBC: 4.85 MIL/uL (ref 4.22–5.81)
RDW: 13.6 % (ref 11.5–15.5)
WBC: 7.9 10*3/uL (ref 4.0–10.5)

## 2017-04-17 LAB — COMPREHENSIVE METABOLIC PANEL
ALBUMIN: 4.4 g/dL (ref 3.5–5.0)
ALT: 46 U/L (ref 17–63)
AST: 32 U/L (ref 15–41)
Alkaline Phosphatase: 61 U/L (ref 38–126)
Anion gap: 13 (ref 5–15)
BUN: 9 mg/dL (ref 6–20)
CO2: 25 mmol/L (ref 22–32)
Calcium: 9.6 mg/dL (ref 8.9–10.3)
Chloride: 102 mmol/L (ref 101–111)
Creatinine, Ser: 1.02 mg/dL (ref 0.61–1.24)
GFR calc Af Amer: >60 mL/min (ref >60)
Glucose, Bld: 142 mg/dL — ABNORMAL HIGH (ref 65–99)
POTASSIUM: 4 mmol/L (ref 3.5–5.1)
SODIUM: 140 mmol/L (ref 135–145)
Total Bilirubin: 0.6 mg/dL (ref 0.3–1.2)
Total Protein: 8.1 g/dL (ref 6.5–8.1)

## 2017-04-17 LAB — I-STAT TROPONIN, ED
TROPONIN I, POC: 0 ng/mL (ref 0.00–0.08)
Troponin i, poc: 0 ng/mL (ref 0.00–0.08)

## 2017-04-17 MED ORDER — FAMOTIDINE 20 MG PO TABS: 20.0000 mg | ORAL_TABLET | Freq: Two times a day (BID) | ORAL | 0 refills | Status: DC

## 2017-04-17 NOTE — ED Triage Notes (Signed)
Patient reports left arm pain with indigestion onset yesterday , seen by PCP yesterday with abnormal EKG , denies SOB or chest pain .

## 2017-04-17 NOTE — ED Provider Notes (Signed)
[facility] EMERGENCY DEPARTMENT Provider Note   CSN: [id number] Arrival date & time: 04/17/17  9518     History   Chief Complaint Chief Complaint  Patient presents with  . Left Arm Pain / Abnormal EKG    HPI [name] is a 67 y.o. male.  67 year old male with history of anxiety, degenerative disc disease, diabetes, and GERD presents with complaint of intermittent left flank and left arm pain.  Patient reports intermittent symptoms over the last week.  Symptoms are worse with movement or lifting.  Patient works as a Development worker, international aid.  Patient denies a specific inciting injury.  Patient denies any chest pain or shortness of breath.  Patient denies associated nausea, vomiting, diaphoresis, or other complaint.  Patient is pain-free now.  Patient was seen by his primary yesterday and was referred to the ED for further cardiac testing.  Patient denies any history of cardiac disease.  Patient reports a normal stress test about 4 years previously.  The history is provided by the patient.  Arm Injury   The current episode started more than 2 days ago. The problem occurs daily. The problem has been resolved. The pain is present in the left arm. The quality of the pain is described as aching. The pain is at a severity of 0/10. The patient is experiencing no pain. Pertinent negatives include no numbness, full range of motion, no stiffness, no tingling and no itching. Exacerbated by: movement. He has tried nothing for the symptoms.    Past Medical History:  Diagnosis Date  . Anxiety   . Cancer ([place])   . DDD (degenerative disc disease), lumbar   . Diabetes mellitus without complication ([place])    new onset 08-2011  . GERD (gastroesophageal reflux disease)   . Hyperlipidemia     Patient Active Problem List   Diagnosis Date Noted  . Diabetes mellitus type 2, uncontrolled ([place]) 01/06/2014  . Hyperlipidemia 01/06/2014    Past Surgical History:  Procedure Laterality Date  .  PROSTATE SURGERY          Home Medications    Prior to Admission medications   Medication Sig Start Date End Date Taking? Authorizing Provider  ALPRAZolam Duanne Moron) 0.5 MG tablet Take 0.5 mg by mouth at bedtime as needed for anxiety.    [provider]  amoxicillin-clavulanate (AUGMENTIN) 875-125 MG per tablet Take 1 tablet by mouth 2 (two) times daily. 10/30/13   Wendie Agreste, MD  aspirin 81 MG tablet Take 81 mg by mouth daily.    [provider]  atorvastatin (LIPITOR) 10 MG tablet Take 10 mg by mouth daily.    [provider]  cetirizine (ZYRTEC) 10 MG tablet Take 10 mg by mouth daily.    [provider]  cyclobenzaprine (FLEXERIL) 10 MG tablet Take 10 mg by mouth 3 (three) times daily as needed for muscle spasms.    [provider]  enalapril (VASOTEC) 2.5 MG tablet Take 2.5 mg by mouth daily.    [provider]  famotidine (PEPCID) 20 MG tablet Take 1 tablet (20 mg total) by mouth 2 (two) times daily. 04/17/17   Valarie Merino, MD  metFORMIN (GLUCOPHAGE) 500 MG tablet Take 500 mg by mouth 2 (two) times daily with a meal.    [provider]  RABEprazole (ACIPHEX) 20 MG tablet Take 20 mg by mouth daily.    [provider]    Family History Family History  Problem Relation Age of Onset  .  Hypertension Father   . Prostate cancer Father   . CVA Father   . Diabetes Sister   . Hypertension Mother   . Hyperlipidemia Mother   . CVA Mother   . Diabetes Brother     Social History Social History   Tobacco Use  . Smoking status: Current Every Day Smoker  . Smokeless tobacco: Current User  Substance Use Topics  . Alcohol use: No  . Drug use: No     Allergies   Patient has no known allergies.   Review of Systems Review of Systems  Respiratory: Negative for chest tightness and shortness of breath.   Musculoskeletal: Negative for stiffness.  Skin: Negative for itching.  Neurological: Negative for  tingling and numbness.  All other systems reviewed and are negative.    Physical Exam Updated Vital Signs BP 131/81   Pulse (!) 58   Temp 98.2 F (36.8 C) (Oral)   Resp 15   Ht 5\' 7"  (1.702 m)   Wt 91.6 kg (202 lb)   SpO2 98%   BMI 31.64 kg/m   Physical Exam  Constitutional: He is oriented to person, place, and time. He appears well-developed and well-nourished. No distress.  HENT:  Head: Normocephalic and atraumatic.  Mouth/Throat: Oropharynx is clear and moist.  Eyes: Pupils are equal, round, and reactive to light. Conjunctivae and EOM are normal.  Neck: Normal range of motion. Neck supple.  Cardiovascular: Normal rate, regular rhythm and normal heart sounds.  Pulmonary/Chest: Effort normal and breath sounds normal. No respiratory distress.  Abdominal: Soft. He exhibits no distension. There is no tenderness.  Musculoskeletal: Normal range of motion. He exhibits no edema or deformity.  Neurological: He is alert and oriented to person, place, and time.  Skin: Skin is warm and dry.  Psychiatric: He has a normal mood and affect.  Nursing note and vitals reviewed.    ED Treatments / Results  Labs (all labs ordered are listed, but only abnormal results are displayed) Labs Reviewed  COMPREHENSIVE METABOLIC PANEL - Abnormal; Notable for the following components:      Result Value   Glucose, Bld 142 (*)    All other components within normal limits  CBC WITH DIFFERENTIAL/PLATELET  I-STAT TROPONIN, ED  I-STAT TROPONIN, ED    EKG EKG Interpretation  Date/Time:  Thursday April 17 2017 10:16:28 EDT Ventricular Rate:  66 PR Interval:  144 QRS Duration: 73 QT Interval:  420 QTC Calculation: 440 R Axis:   59 Text Interpretation:  Sinus rhythm Atrial premature complexes LAE, consider biatrial enlargement Abnormal R-wave progression, early transition Confirmed by Dene Gentry [phone]) on 04/17/2017 10:45:11 AM   Radiology No results found.  Procedures Procedures  (including critical care time)  Medications Ordered in ED Medications - No data to display   Initial Impression / Assessment and Plan / ED Course  I have reviewed the triage vital signs and the nursing notes.  Pertinent labs & imaging results that were available during my care of the patient were reviewed by me and considered in my medical decision making (see chart for details).     MDM  Screen complete  Patient is presenting with left arm pain. This is most likely secondary to musculoskeletal cause.  Patient is without symptoms at this time.  I doubt ACS.  EKG is without acute ischemic changes.  Troponin x2 is negative.  Other screening labs do not suggest any other acute pathology.  Patient desires discharge home.  Close follow-up is advised.  Strict return precautions are given and understood.  Will trial course of Pepcid at patient's wife's request.  She is concerned that perhaps some of his symptoms are secondary to GERD.  Final Clinical Impressions(s) / ED Diagnoses   Final diagnoses:  Gastroesophageal reflux disease, esophagitis presence not specified    ED Discharge Orders        Ordered    famotidine (PEPCID) 20 MG tablet  2 times daily     04/17/17 1144       Valarie Merino, MD 04/17/17 1156

## 2017-04-17 NOTE — ED Notes (Signed)
Pt ambulatory too restroom no difficulties.

## 2018-03-18 NOTE — Progress Notes (Signed)
Patient is here for follow up visit.  Subjective:   '@Patient'  ID: [name], male    DOB: 31-Aug-1950, 68 y.o.   MRN: [id number]  Chief Complaint  Patient presents with   Hypertension     HPI  68 y/o [place] American male with hypertension, type 2 DM,  hyperlipidemia, former smoker, h/o prostate cancer s/p prostatectomy, atypical chest pain, low risk stress test 05/2017.  Since his last visit with me, he has quit smoking. He admits that he still struggles with his cravings, but is determined to stay away from smoking. He denies chest pain, shortness of breath, palpitations, leg edema, orthopnea, PND, TIA/syncope.  Past Medical History:  Diagnosis Date   Anxiety    Cancer ([place])    DDD (degenerative disc disease), lumbar    Diabetes mellitus without complication ([place])    new onset 08-2011   GERD (gastroesophageal reflux disease)    Hyperlipidemia      Past Surgical History:  Procedure Laterality Date   PROSTATE SURGERY       Social History   Socioeconomic History   Marital status: Married    Spouse name: Not on file   Number of children: Not on file   Years of education: Not on file   Highest education level: Not on file  Occupational History   Not on file  Social Needs   Financial resource strain: Not on file   Food insecurity:    Worry: Not on file    Inability: Not on file   Transportation needs:    Medical: Not on file    Non-medical: Not on file  Tobacco Use   Smoking status: Current Every Day Smoker   Smokeless tobacco: Current User  Substance and Sexual Activity   Alcohol use: No   Drug use: No   Sexual activity: Not on file  Lifestyle   Physical activity:    Days per week: Not on file    Minutes per session: Not on file   Stress: Not on file  Relationships   Social connections:    Talks on phone: Not on file    Gets together: Not on file    Attends religious service: Not on file    Active member of club or  organization: Not on file    Attends meetings of clubs or organizations: Not on file    Relationship status: Not on file   Intimate partner violence:    Fear of current or ex partner: Not on file    Emotionally abused: Not on file    Physically abused: Not on file    Forced sexual activity: Not on file  Other Topics Concern   Not on file  Social History Narrative   Not on file     Current Outpatient Medications on File Prior to Visit  Medication Sig Dispense Refill   ALPRAZolam (XANAX) 0.5 MG tablet Take 0.5 mg by mouth at bedtime as needed for anxiety.     aspirin 81 MG tablet Take 81 mg by mouth daily.     atorvastatin (LIPITOR) 10 MG tablet Take 10 mg by mouth daily.     cetirizine (ZYRTEC) 10 MG tablet Take 10 mg by mouth daily.     enalapril (VASOTEC) 2.5 MG tablet Take 2.5 mg by mouth daily.     metFORMIN (GLUCOPHAGE) 500 MG tablet Take 500 mg by mouth 2 (two) times daily with a meal.     pantoprazole (PROTONIX) 40 MG tablet Take  40 mg by mouth daily.     amoxicillin-clavulanate (AUGMENTIN) 875-125 MG per tablet Take 1 tablet by mouth 2 (two) times daily. 20 tablet 0   cyclobenzaprine (FLEXERIL) 10 MG tablet Take 10 mg by mouth 3 (three) times daily as needed for muscle spasms.     famotidine (PEPCID) 20 MG tablet Take 1 tablet (20 mg total) by mouth 2 (two) times daily. (Patient not taking: Reported on 03/19/2018) 30 tablet 0   RABEprazole (ACIPHEX) 20 MG tablet Take 20 mg by mouth daily.     No current facility-administered medications on file prior to visit.     Cardiovascular studies:   EKG 04/24/2017: Sinus rhythm 77 bpm. Normal axis. Normal conduction. Occasional PAC. Low voltage precordial leads.   Exercise myoview stress 05/16/2017: 1. The patient performed treadmill exercise using a Modified Bruce protocol, completing 5 minutes. The patient completed an estimated workload of 5.9 METS, reaching 93% of the maximum predicted heart rate. Low exercise  capacity. Stress symptoms included leg fatigue. Resting hypertension 154/92 mmHg with normal hemodynamic response with peak BP 174/80 mmHg. No ischemic changes seen on stress electrocardiogram. 2. The overall quality of the study is excellent. There is no evidence of abnormal lung activity. Stress and rest SPECT images demonstrate homogeneous tracer distribution throughout the myocardium. Gated SPECT imaging reveals normal myocardial thickening and wall motion. The left ventricular ejection fraction was normal (51%). 3. Low risk study.   Recent labs:    Labs 04/17/2017: H/H 13.3/40.6. MCV 83. Platelets 253 Glucose 142. BN/Cr 9/1.02. eGFR normal. Na/K 140/4.0  Review of Systems  Constitution: Negative for decreased appetite, malaise/fatigue, weight gain and weight loss.  HENT: Negative for congestion.   Eyes: Negative for visual disturbance.  Cardiovascular: Negative for chest pain, dyspnea on exertion, leg swelling, orthopnea, palpitations and syncope.  Respiratory: Negative for shortness of breath.   Endocrine: Negative for cold intolerance.  Hematologic/Lymphatic: Does not bruise/bleed easily.  Skin: Negative for itching and rash.  Musculoskeletal: Negative for myalgias.  Gastrointestinal: Negative for abdominal pain, nausea and vomiting.  Genitourinary: Negative for dysuria.  Neurological: Negative for dizziness and weakness.  Psychiatric/Behavioral: The patient is not nervous/anxious.   All other systems reviewed and are negative.      Objective:    Vitals:   03/19/18 1406  BP: 137/81  Pulse: (!) 101  SpO2: 97%     Physical Exam  Constitutional: He is oriented to person, place, and time. He appears well-developed and well-nourished. No distress.  Mildly obese  HENT:  Head: Normocephalic and atraumatic.  Eyes: Pupils are equal, round, and reactive to light. Conjunctivae are normal.  Neck: Neck supple. No JVD present. No thyromegaly present.  Short neck and difficult to  evaluate JVP  Cardiovascular: Normal rate, regular rhythm, normal heart sounds and intact distal pulses. Exam reveals no gallop.  No murmur heard. Pulses:      Carotid pulses are 2+ on the right side and 2+ on the left side.      Dorsalis pedis pulses are 2+ on the right side and 2+ on the left side.       Posterior tibial pulses are 2+ on the right side and 2+ on the left side.  Femoral and popliteal pulse difficult to feel due to patient's body habitus.   Pulmonary/Chest: Effort normal and breath sounds normal. He has no wheezes. He has no rales.  Abdominal: Soft. Bowel sounds are normal. There is no rebound.  Musculoskeletal: Normal range of motion.  General: No edema.  Lymphadenopathy:    He has no cervical adenopathy.  Neurological: He is alert and oriented to person, place, and time. No cranial nerve deficit.  Skin: Skin is warm and dry.  Psychiatric: He has a normal mood and affect.  Nursing note and vitals reviewed.       Assessment & Recommendations:    68 y/o [place] American male with hypertension, type 2 DM,  hyperlipidemia, former smoker, h/o prostate cancer s/p prostatectomy, atypical chest pain, low risk stress test 05/2017.  Hypertension: Controlled.   Prior chest pain: Currently resolved. Low risk stress test 2019.  Type 2 DM: Continue follow up with PCP. Continue lipitor.  In absence of bleeding, okay to continue aspirin for primary prevention.   I congratulated him about quitting smoking.  I will see him back in 1 year.   Nigel Mormon, MD [facility] Pager: [phone] Office: [phone] If no answer Cell [phone]

## 2018-03-19 ENCOUNTER — Encounter: Payer: Self-pay | Admitting: Cardiology

## 2018-03-19 ENCOUNTER — Ambulatory Visit (INDEPENDENT_AMBULATORY_CARE_PROVIDER_SITE_OTHER): Payer: Medicare Other | Admitting: Cardiology

## 2018-03-19 ENCOUNTER — Other Ambulatory Visit: Payer: Self-pay

## 2018-03-19 VITALS — BP 137/81 | HR 101 | Ht 68.0 in | Wt 203.6 lb

## 2018-03-19 DIAGNOSIS — I10 Essential (primary) hypertension: Secondary | ICD-10-CM | POA: Insufficient documentation

## 2018-04-02 ENCOUNTER — Other Ambulatory Visit: Payer: Self-pay

## 2018-04-02 ENCOUNTER — Ambulatory Visit
Admission: EM | Admit: 2018-04-02 | Discharge: 2018-04-02 | Disposition: A | Discharge status: Home / Self Care | Payer: Medicare Other | Attending: Family Medicine | Admitting: Family Medicine

## 2018-04-02 ENCOUNTER — Encounter: Payer: Self-pay | Admitting: Emergency Medicine

## 2018-04-02 DIAGNOSIS — W25XXXA Contact with sharp glass, initial encounter: Secondary | ICD-10-CM | POA: Diagnosis not present

## 2018-04-02 DIAGNOSIS — S61411A Laceration without foreign body of right hand, initial encounter: Secondary | ICD-10-CM

## 2018-04-02 NOTE — Discharge Instructions (Signed)

## 2018-04-02 NOTE — ED Triage Notes (Signed)
Pt presents to [facility] for assessment of laceration caused by a piece of a broken bottle to palm of right hand.  Bleeding controlled.

## 2018-04-02 NOTE — ED Provider Notes (Signed)
[facility]    CSN: [id number] Arrival date & time: 04/02/18  1233     History   Chief Complaint Chief Complaint  Patient presents with  . Laceration    HPI [name] is a 68 y.o. male history of hypertension, DM type II, hyperlipidemia presenting today for evaluation of laceration.  Patient sustained a laceration to his right palm approximately 1.5 hours ago.  He states he was picking up glass off the ground and cut him.  Believes his tetanus was updated in the last 5 years.  Denies difficulty moving fingers or hand.  Denies numbness or tingling.  Initially cleansed with alcohol.  HPI  Past Medical History:  Diagnosis Date  . Anxiety   . Cancer ([place])   . DDD (degenerative disc disease), lumbar   . Diabetes mellitus without complication ([place])    new onset 08-2011  . GERD (gastroesophageal reflux disease)   . Hyperlipidemia     Patient Active Problem List   Diagnosis Date Noted  . Essential hypertension 03/19/2018  . Diabetes mellitus type 2, uncontrolled ([place]) 01/06/2014  . Hyperlipidemia 01/06/2014    Past Surgical History:  Procedure Laterality Date  . PROSTATE SURGERY         Home Medications    Prior to Admission medications   Medication Sig Start Date End Date Taking? Authorizing Provider  ALPRAZolam Duanne Moron) 0.5 MG tablet Take 0.5 mg by mouth at bedtime as needed for anxiety.    [provider]  amoxicillin-clavulanate (AUGMENTIN) 875-125 MG per tablet Take 1 tablet by mouth 2 (two) times daily. 10/30/13   Wendie Agreste, MD  aspirin 81 MG tablet Take 81 mg by mouth daily.    [provider]  atorvastatin (LIPITOR) 10 MG tablet Take 10 mg by mouth daily.    [provider]  cetirizine (ZYRTEC) 10 MG tablet Take 10 mg by mouth daily.    [provider]  cyclobenzaprine (FLEXERIL) 10 MG tablet Take 10 mg by mouth 3 (three) times daily as needed for muscle spasms.    [provider]   enalapril (VASOTEC) 2.5 MG tablet Take 2.5 mg by mouth daily.    [provider]  famotidine (PEPCID) 20 MG tablet Take 1 tablet (20 mg total) by mouth 2 (two) times daily. Patient not taking: Reported on 03/19/2018 04/17/17   Valarie Merino, MD  metFORMIN (GLUCOPHAGE) 500 MG tablet Take 500 mg by mouth 2 (two) times daily with a meal.    [provider]  pantoprazole (PROTONIX) 40 MG tablet Take 40 mg by mouth daily.    [provider]  RABEprazole (ACIPHEX) 20 MG tablet Take 20 mg by mouth daily.    [provider]    Family History Family History  Problem Relation Age of Onset  . Hypertension Father   . Prostate cancer Father   . CVA Father   . Diabetes Sister   . Hypertension Mother   . Hyperlipidemia Mother   . CVA Mother   . Diabetes Brother     Social History Social History   Tobacco Use  . Smoking status: Former Research scientist (life sciences)  . Smokeless tobacco: Current User  Substance Use Topics  . Alcohol use: No  . Drug use: No     Allergies   Patient has no known allergies.   Review of Systems Review of Systems  Constitutional: Negative for fatigue and fever.  Eyes: Negative for redness, itching and visual disturbance.  Respiratory: Negative  for shortness of breath.   Cardiovascular: Negative for chest pain and leg swelling.  Gastrointestinal: Negative for nausea and vomiting.  Musculoskeletal: Negative for arthralgias and myalgias.  Skin: Positive for wound. Negative for color change and rash.  Neurological: Negative for dizziness, syncope, weakness, light-headedness and headaches.     Physical Exam Triage Vital Signs ED Triage Vitals  Enc Vitals Group     BP 04/02/18 1248 (!) 165/82     Pulse Rate 04/02/18 1248 (!) 102     Resp 04/02/18 1248 16     Temp 04/02/18 1248 98.2 F (36.8 C)     Temp Source 04/02/18 1248 Oral     SpO2 04/02/18 1248 97 %     Weight --      Height --      Head Circumference --      Peak Flow --       Pain Score 04/02/18 1250 4     Pain Loc --      Pain Edu? --      Excl. in [place]? --    No data found.  Updated Vital Signs BP (!) 165/82 (BP Location: Left Arm)   Pulse (!) 102   Temp 98.2 F (36.8 C) (Oral)   Resp 16   SpO2 97%   Visual Acuity Right Eye Distance:   Left Eye Distance:   Bilateral Distance:    Right Eye Near:   Left Eye Near:    Bilateral Near:     Physical Exam Vitals signs and nursing note reviewed.  Constitutional:      Appearance: He is well-developed.     Comments: No acute distress  HENT:     Head: Normocephalic and atraumatic.     Nose: Nose normal.  Eyes:     Conjunctiva/sclera: Conjunctivae normal.  Neck:     Musculoskeletal: Neck supple.  Cardiovascular:     Rate and Rhythm: Normal rate.  Pulmonary:     Effort: Pulmonary effort is normal. No respiratory distress.  Abdominal:     General: There is no distension.  Musculoskeletal: Normal range of motion.     Comments: Full active range of motion of all 5 fingers of right hand and wrist Radial pulse 2+ Sensation intact distally  Skin:    General: Skin is warm and dry.     Comments: Right palmar surface with 1.25 cm laceration to distal fifth meta carpal area distal flap appears slightly gaping  Neurological:     Mental Status: He is alert and oriented to person, place, and time.      UC Treatments / Results  Labs (all labs ordered are listed, but only abnormal results are displayed) Labs Reviewed - No data to display  EKG None  Radiology No results found.  Procedures Laceration Repair Date/Time: 04/02/2018 1:45 PM Performed by: Wieters, Elesa Hacker, PA-C Authorized by: Raylene Everts, MD   Consent:    Consent obtained:  Verbal   Consent given by:  Patient   Risks discussed:  Infection, pain, poor cosmetic result and poor wound healing   Alternatives discussed:  No treatment Anesthesia (see MAR for exact dosages):    Anesthesia method:  Local infiltration   Local  anesthetic:  Lidocaine 2% w/o epi Laceration details:    Location:  Hand   Hand location:  R palm   Length (cm):  1.3   Depth (mm):  3 Repair type:    Repair type:  Simple Pre-procedure details:  Preparation:  Patient was prepped and draped in usual sterile fashion Exploration:    Hemostasis achieved with:  Direct pressure   Wound exploration: wound explored through full range of motion     Wound extent: no foreign bodies/material noted, no nerve damage noted, no tendon damage noted and no underlying fracture noted   Treatment:    Area cleansed with:  Betadine and soap and water   Amount of cleaning:  Standard   Irrigation solution:  Sterile water   Irrigation volume:  200   Irrigation method:  Syringe   Visualized foreign bodies/material removed: no   Skin repair:    Repair method:  Sutures   Suture size:  4-0   Suture material:  Prolene   Suture technique:  Simple interrupted   Number of sutures:  4 Approximation:    Approximation:  Close Post-procedure details:    Dressing:  Non-adherent dressing   Patient tolerance of procedure:  Tolerated well, no immediate complications   (including critical care time)  Medications Ordered in UC Medications - No data to display  Initial Impression / Assessment and Plan / UC Course  I have reviewed the triage vital signs and the nursing notes.  Pertinent labs & imaging results that were available during my care of the patient were reviewed by me and considered in my medical decision making (see chart for details).     Laceration repaired, do not suspect underlying tendon or bone damage.  Discussed wound care, stitches to be removed in 7 to 10 days.Discussed strict return precautions. Patient verbalized understanding and is agreeable with plan.  Final Clinical Impressions(s) / UC Diagnoses   Final diagnoses:  Laceration of right hand without foreign body, initial encounter     Discharge Instructions     WOUND CARE Please  return in 7-10 days to have your stitches/staples removed or sooner if you have concerns. Marland Kitchen Keep area clean and dry for 24 hours. Do not remove bandage, if applied. . After 24 hours, remove bandage and wash wound gently with mild soap and warm water. Reapply a new bandage after cleaning wound, if directed. . Continue daily cleansing with soap and water until stitches/staples are removed. . Do not apply any ointments or creams to the wound while stitches/staples are in place, as this may cause delayed healing. . Notify the office if you experience any of the following signs of infection: Swelling, redness, pus drainage, streaking, fever >101.0 F . Notify the office if you experience excessive bleeding that does not stop after 15-20 minutes of constant, firm pressure.   ED Prescriptions    None     Controlled Substance Prescriptions [place] Controlled Substance Registry consulted? Not Applicable   Janith Lima, [place] 04/02/18 1346

## 2018-04-02 NOTE — ED Notes (Signed)
Patient able to ambulate independently  

## 2018-04-20 ENCOUNTER — Other Ambulatory Visit: Payer: Self-pay | Admitting: Cardiology

## 2018-09-07 ENCOUNTER — Other Ambulatory Visit: Payer: Self-pay | Admitting: Family Medicine

## 2018-09-07 DIAGNOSIS — R748 Abnormal levels of other serum enzymes: Secondary | ICD-10-CM

## 2018-09-08 ENCOUNTER — Other Ambulatory Visit: Payer: Self-pay | Admitting: Family Medicine

## 2018-09-08 DIAGNOSIS — R7989 Other specified abnormal findings of blood chemistry: Secondary | ICD-10-CM

## 2018-09-08 DIAGNOSIS — R748 Abnormal levels of other serum enzymes: Secondary | ICD-10-CM

## 2018-09-11 ENCOUNTER — Ambulatory Visit
Admission: RE | Admit: 2018-09-11 | Discharge: 2018-09-11 | Disposition: A | Discharge status: Home / Self Care | Payer: Medicare Other | Source: Ambulatory Visit | Attending: Family Medicine | Admitting: Family Medicine

## 2018-09-11 DIAGNOSIS — R7989 Other specified abnormal findings of blood chemistry: Secondary | ICD-10-CM

## 2018-09-11 DIAGNOSIS — R748 Abnormal levels of other serum enzymes: Secondary | ICD-10-CM

## 2018-09-16 ENCOUNTER — Other Ambulatory Visit: Payer: Self-pay | Admitting: Family Medicine

## 2018-09-16 DIAGNOSIS — R748 Abnormal levels of other serum enzymes: Secondary | ICD-10-CM

## 2018-09-16 DIAGNOSIS — R932 Abnormal findings on diagnostic imaging of liver and biliary tract: Secondary | ICD-10-CM

## 2018-10-09 ENCOUNTER — Ambulatory Visit
Admission: RE | Admit: 2018-10-09 | Discharge: 2018-10-09 | Disposition: A | Discharge status: Home / Self Care | Payer: Medicare Other | Source: Ambulatory Visit | Attending: Family Medicine | Admitting: Family Medicine

## 2018-10-09 ENCOUNTER — Other Ambulatory Visit: Payer: Self-pay

## 2018-10-09 DIAGNOSIS — R932 Abnormal findings on diagnostic imaging of liver and biliary tract: Secondary | ICD-10-CM

## 2018-10-09 DIAGNOSIS — R748 Abnormal levels of other serum enzymes: Secondary | ICD-10-CM

## 2018-10-09 MED ORDER — GADOBENATE DIMEGLUMINE 529 MG/ML IV SOLN
19.0000 mL | Freq: Once | INTRAVENOUS | Status: AC | PRN
  Administered 2018-10-09: 15:00:00 19 mL via INTRAVENOUS

## 2018-10-16 ENCOUNTER — Other Ambulatory Visit: Payer: Self-pay | Admitting: Family Medicine

## 2018-10-16 DIAGNOSIS — E278 Other specified disorders of adrenal gland: Secondary | ICD-10-CM

## 2018-10-27 ENCOUNTER — Ambulatory Visit
Admission: RE | Admit: 2018-10-27 | Discharge: 2018-10-27 | Disposition: A | Discharge status: Home / Self Care | Payer: Medicare Other | Source: Ambulatory Visit | Attending: Family Medicine | Admitting: Family Medicine

## 2018-10-27 DIAGNOSIS — E278 Other specified disorders of adrenal gland: Secondary | ICD-10-CM

## 2018-11-02 ENCOUNTER — Other Ambulatory Visit: Payer: Self-pay | Admitting: Family Medicine

## 2018-11-02 DIAGNOSIS — E279 Disorder of adrenal gland, unspecified: Secondary | ICD-10-CM

## 2018-11-03 ENCOUNTER — Other Ambulatory Visit: Payer: Self-pay | Admitting: Family Medicine

## 2018-11-03 DIAGNOSIS — E279 Disorder of adrenal gland, unspecified: Secondary | ICD-10-CM

## 2018-11-06 ENCOUNTER — Ambulatory Visit
Admission: RE | Admit: 2018-11-06 | Discharge: 2018-11-06 | Disposition: A | Discharge status: Home / Self Care | Payer: Medicare Other | Source: Ambulatory Visit | Attending: Family Medicine | Admitting: Family Medicine

## 2018-11-06 DIAGNOSIS — E279 Disorder of adrenal gland, unspecified: Secondary | ICD-10-CM

## 2018-11-06 MED ORDER — IOPAMIDOL (ISOVUE-300) INJECTION 61%
125.0000 mL | Freq: Once | INTRAVENOUS | Status: AC | PRN
  Administered 2018-11-06: 125 mL via INTRAVENOUS

## 2018-12-12 ENCOUNTER — Other Ambulatory Visit: Payer: Self-pay | Admitting: Cardiology

## 2018-12-15 ENCOUNTER — Other Ambulatory Visit: Payer: Self-pay | Admitting: Endocrinology

## 2018-12-15 DIAGNOSIS — D3500 Benign neoplasm of unspecified adrenal gland: Secondary | ICD-10-CM

## 2019-02-02 ENCOUNTER — Ambulatory Visit: Payer: Medicare Other

## 2019-02-12 ENCOUNTER — Ambulatory Visit: Payer: Medicare Other | Attending: Internal Medicine

## 2019-02-12 DIAGNOSIS — Z23 Encounter for immunization: Secondary | ICD-10-CM | POA: Insufficient documentation

## 2019-02-12 NOTE — Progress Notes (Signed)
   Covid-19 Vaccination Clinic  Name:  STRAN BECTON    MRN: [id number] DOB: [birth year]  02/12/2019  Mr. Gunkel was observed post Covid-19 immunization for 15 minutes without incidence. He was provided with Vaccine Information Sheet and instruction to access the V-Safe system.   Mr. Bussell was instructed to call 911 with any severe reactions post vaccine: Marland Kitchen Difficulty breathing  . Swelling of your face and throat  . A fast heartbeat  . A bad rash all over your body  . Dizziness and weakness    Immunizations Administered    Name Date Dose VIS Date Route   Pfizer COVID-19 Vaccine 02/12/2019 10:23 AM 0.3 mL 12/18/2018 Intramuscular   Manufacturer: [place]   Lot: [id number]   [place]: [id number]

## 2019-02-23 ENCOUNTER — Ambulatory Visit: Payer: Medicare Other

## 2019-03-09 ENCOUNTER — Ambulatory Visit: Payer: Medicare Other | Attending: Internal Medicine

## 2019-03-09 DIAGNOSIS — Z23 Encounter for immunization: Secondary | ICD-10-CM | POA: Insufficient documentation

## 2019-03-09 NOTE — Progress Notes (Signed)
   Covid-19 Vaccination Clinic  Name:  [name]    MRN: [id number] DOB: 1950-04-24  03/09/2019  [name] was observed post Covid-19 immunization for 15 minutes without incident. He was provided with Vaccine Information Sheet and instruction to access the V-Safe system.   [name] was instructed to call 911 with any severe reactions post vaccine: Marland Kitchen Difficulty breathing  . Swelling of face and throat  . A fast heartbeat  . A bad rash all over body  . Dizziness and weakness   Immunizations Administered    Name Date Dose VIS Date Route   Pfizer COVID-19 Vaccine 03/09/2019  8:34 AM 0.3 mL 12/18/2018 Intramuscular   Manufacturer: [place]   Lot: [id number]   [place]: [id number]

## 2019-03-19 ENCOUNTER — Ambulatory Visit: Payer: Medicare Other | Admitting: Cardiology

## 2019-03-19 ENCOUNTER — Encounter: Payer: Self-pay | Admitting: Cardiology

## 2019-03-19 ENCOUNTER — Other Ambulatory Visit: Payer: Self-pay

## 2019-03-19 VITALS — BP 136/88 | HR 97 | Temp 98.4°F | Ht 68.0 in | Wt 211.0 lb

## 2019-03-19 DIAGNOSIS — I10 Essential (primary) hypertension: Secondary | ICD-10-CM

## 2019-03-19 NOTE — Progress Notes (Signed)
Patient is here for follow up visit.  Subjective:   _0  ID: [name], male    DOB: Jan 08, 1951, 69 y.o.   MRN: [id number]  Chief Complaint  Patient presents with  . Hypertension    1 year f/u..shoulder pain  . Chest Pain     HPI  69 y/o [place] American male with hypertension, type 2 DM,  hyperlipidemia, former smoker, h/o prostate cancer s/p prostatectomy, atypical chest pain, low risk stress test 05/2017.  Since 2019, he has quit smoking. He admits that he still struggles with his cravings, but is determined to stay away from smoking. He denies chest pain, palpitations, leg edema, orthopnea, PND, TIA/syncope. He has mild, unchanged, shortness of breath with more than usual exertion.    Current Outpatient Medications on File Prior to Visit  Medication Sig Dispense Refill  . ALPRAZolam (XANAX) 0.5 MG tablet Take 0.5 mg by mouth at bedtime as needed for anxiety.    Marland Kitchen amoxicillin-clavulanate (AUGMENTIN) 875-125 MG per tablet Take 1 tablet by mouth 2 (two) times daily. 20 tablet 0  . aspirin 81 MG tablet Take 81 mg by mouth daily.    Marland Kitchen atorvastatin (LIPITOR) 10 MG tablet Take 10 mg by mouth daily.    Marland Kitchen buPROPion (WELLBUTRIN XL) 150 MG 24 hr tablet TAKE 1 TABLET BY MOUTH EVERY DAY 90 tablet 3  . cetirizine (ZYRTEC) 10 MG tablet Take 10 mg by mouth daily.    . cyclobenzaprine (FLEXERIL) 10 MG tablet Take 10 mg by mouth 3 (three) times daily as needed for muscle spasms.    . enalapril (VASOTEC) 2.5 MG tablet Take 2.5 mg by mouth daily.    . famotidine (PEPCID) 20 MG tablet Take 1 tablet (20 mg total) by mouth 2 (two) times daily. (Patient not taking: Reported on 03/19/2018) 30 tablet 0  . metFORMIN (GLUCOPHAGE) 500 MG tablet Take 500 mg by mouth 2 (two) times daily with a meal.    . nitroGLYCERIN (NITROSTAT) 0.4 MG SL tablet TAKE 1 TABLET BY MOUTH AS DIRECTED 25 tablet 3  . pantoprazole (PROTONIX) 40 MG tablet Take 40 mg by mouth daily.    . RABEprazole (ACIPHEX) 20 MG  tablet Take 20 mg by mouth daily.     No current facility-administered medications on file prior to visit.    Cardiovascular studies:  EKG 03/19/2019: Sinus rhythm 99 bpm. Possible old anteroseptal infarct.  Exercise myoview stress 05/16/2017: 1. The patient performed treadmill exercise using a Modified Bruce protocol, completing 5 minutes. The patient completed an estimated workload of 5.9 METS, reaching 93% of the maximum predicted heart rate. Low exercise capacity. Stress symptoms included leg fatigue. Resting hypertension 154/92 mmHg with normal hemodynamic response with peak BP 174/80 mmHg. No ischemic changes seen on stress electrocardiogram. 2. The overall quality of the study is excellent. There is no evidence of abnormal lung activity. Stress and rest SPECT images demonstrate homogeneous tracer distribution throughout the myocardium. Gated SPECT imaging reveals normal myocardial thickening and wall motion. The left ventricular ejection fraction was normal (51%). 3. Low risk study.   Recent labs: 08/19/2018: Glucose 105, BUN/Cr 13/1.0.  HbA1C 8.0% Chol 163, TG 179, HDL 51, LDL 77 TSH 1.0 normal  04/17/2017:  H/H 13.3/40.6. MCV 83. Platelets 253 Glucose 142. BN/Cr 9/1.02. eGFR normal. Na/K 140/4.0  Review of Systems  Cardiovascular: Positive for dyspnea on exertion (Mild, chronic, stable). Negative for chest pain, leg swelling, palpitations and syncope.       Objective:  Vitals:   03/19/19 1030  BP: 136/88  Pulse: 97  Temp: 98.4 F (36.9 C)  SpO2: 98%     Physical Exam  Constitutional: He appears well-developed and well-nourished.  Neck: No JVD present.  Cardiovascular: Normal rate, regular rhythm, normal heart sounds and intact distal pulses.  No murmur heard. Pulmonary/Chest: Effort normal and breath sounds normal. He has no wheezes. He has no rales.  Musculoskeletal:        General: No edema.     Comments: Clubbing in fingers of both hands  Nursing note  and vitals reviewed.       Assessment & Recommendations:    69 y/o [place] American male with hypertension, type 2 DM,  hyperlipidemia, former smoker, h/o prostate cancer s/p prostatectomy, atypical chest pain, low risk stress test 05/2017.  Hypertension: Controlled.   Prior chest pain: Currently resolved. Low risk stress test 2019.  Type 2 DM: Continue follow up with PCP. Continue lipitor.  In absence of bleeding, okay to continue aspirin for primary prevention.   I congratulated him about quitting smoking. I suspect he has clubbing from COPD. Consider PFT for evaluation and management.   I will see him back in 1 year.   Nigel Mormon, MD [facility] Pager: [phone] Office: [phone] If no answer Cell [phone]

## 2019-06-08 ENCOUNTER — Other Ambulatory Visit: Payer: Medicare Other

## 2019-06-28 ENCOUNTER — Ambulatory Visit
Admission: RE | Admit: 2019-06-28 | Discharge: 2019-06-28 | Disposition: A | Discharge status: Home / Self Care | Payer: Medicare Other | Source: Ambulatory Visit | Attending: Endocrinology | Admitting: Endocrinology

## 2019-06-28 DIAGNOSIS — D3500 Benign neoplasm of unspecified adrenal gland: Secondary | ICD-10-CM

## 2019-06-28 MED ORDER — IOPAMIDOL (ISOVUE-300) INJECTION 61%
125.0000 mL | Freq: Once | INTRAVENOUS | Status: AC | PRN
  Administered 2019-06-28: 125 mL via INTRAVENOUS

## 2019-08-11 ENCOUNTER — Other Ambulatory Visit: Payer: Self-pay

## 2019-08-11 ENCOUNTER — Ambulatory Visit (INDEPENDENT_AMBULATORY_CARE_PROVIDER_SITE_OTHER): Payer: Medicare Other

## 2019-08-11 ENCOUNTER — Ambulatory Visit: Payer: Medicare Other | Admitting: Orthopaedic Surgery

## 2019-08-11 ENCOUNTER — Encounter: Payer: Self-pay | Admitting: Orthopaedic Surgery

## 2019-08-11 VITALS — Ht 67.0 in | Wt 200.0 lb

## 2019-08-11 DIAGNOSIS — M25511 Pain in right shoulder: Secondary | ICD-10-CM

## 2019-08-11 DIAGNOSIS — G8929 Other chronic pain: Secondary | ICD-10-CM | POA: Diagnosis not present

## 2019-08-11 DIAGNOSIS — Z96611 Presence of right artificial shoulder joint: Secondary | ICD-10-CM | POA: Diagnosis not present

## 2019-08-11 NOTE — Progress Notes (Signed)
Office Visit Note   Patient: [name]           Date of Birth: Aug 03, 1950           MRN: [id number] Visit Date: 08/11/2019              Requested by: Donald Prose, MD [address] [place],  [place] [id number] PCP: Donald Prose, MD   Assessment & Plan: Visit Diagnoses:  1. Chronic right shoulder pain   2. Presence of right artificial shoulder joint     Plan: Chronic impingement syndrome right shoulder of over 6 months.  I suspect he may have a rotator cuff tear.  I am hesitant to inject the shoulder with cortisone as he is diabetic.  Will obtain MRI scan  Follow-Up Instructions: Return After MRI scan right shoulder.   Orders:  Orders Placed This Encounter  Procedures  . XR Shoulder Right  . MR SHOULDER RIGHT WO CONTRAST   No orders of the defined types were placed in this encounter.     Procedures: No procedures performed   Clinical Data: No additional findings.   Subjective: Chief Complaint  Patient presents with  . Right Shoulder - Pain  Patient presents today for right shoulder pain. No known injury. His pain initially started with pain only superiorly. He has been with range of motion. He has been having this pain for a year and seems to be worsening. No numbness or tingling. He has not noticed a difference in strength. He was taking Ibuprofen until Dr.Sun started him on Meloxicam. He is right hand dominant. 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  Has difficulty sleeping on the right side as it will "waken me."  HPI  Review of Systems   Objective: Vital Signs: Ht 5\' 7"  (1.702 m)   Wt 200 lb (90.7 kg)   BMI 31.32 kg/m   Physical Exam Constitutional:      Appearance: He is well-developed.  Eyes:     Pupils: Pupils are equal, round, and reactive to light.  Pulmonary:     Effort: Pulmonary effort is normal.  Skin:    General: Skin is warm and dry.  Neurological:     Mental Status: He is  alert and oriented to person, place, and time.  Psychiatric:        Behavior: Behavior normal.     Ortho Exam right shoulder with positive impingement and empty can testing.  Minimally positive Speed sign.  Biceps appears to be intact.  Skin intact.  Full overhead motion but with a circuitous arc of motion.  No crepitation.  Some pain along the anterior subacromial region.  Good grip and good release.  Appears to have good strength.  No pain with range of motion of cervical spine  Specialty Comments:  No specialty comments available.  Imaging: XR Shoulder Right  Result Date: 08/11/2019 Films of the right shoulder obtained in several projections.  Humeral head is centered about the glenoid.  No ectopic calcification.  Normal space between the humeral head and the acromium.  Mild degenerative changes at the [facility] joint.  No acute changes    PMFS History: Patient Active Problem List   Diagnosis Date Noted  . Pain in right shoulder 08/11/2019  . Essential hypertension 03/19/2018  . Diabetes mellitus type 2, uncontrolled ([place]) 01/06/2014  . Hyperlipidemia 01/06/2014   Past Medical History:  Diagnosis Date  . Anxiety   .  Cancer ([place])   . DDD (degenerative disc disease), lumbar   . Diabetes mellitus without complication ([place])    new onset 08-2011  . GERD (gastroesophageal reflux disease)   . Hyperlipidemia     Family History  Problem Relation Age of Onset  . Hypertension Father   . Prostate cancer Father   . CVA Father   . Diabetes Sister   . Hypertension Mother   . Hyperlipidemia Mother   . CVA Mother   . Diabetes Brother     Past Surgical History:  Procedure Laterality Date  . PROSTATE SURGERY     Social History   Occupational History  . Not on file  Tobacco Use  . Smoking status: Former Research scientist (life sciences)  . Smokeless tobacco: Current User  Substance and Sexual Activity  . Alcohol use: No  . Drug use: No  . Sexual activity: Not on file

## 2019-08-24 ENCOUNTER — Other Ambulatory Visit: Payer: Self-pay

## 2019-08-24 ENCOUNTER — Ambulatory Visit
Admission: RE | Admit: 2019-08-24 | Discharge: 2019-08-24 | Disposition: A | Discharge status: Home / Self Care | Payer: Medicare Other | Source: Ambulatory Visit | Attending: Orthopaedic Surgery | Admitting: Orthopaedic Surgery

## 2019-08-24 DIAGNOSIS — Z96611 Presence of right artificial shoulder joint: Secondary | ICD-10-CM

## 2019-09-07 ENCOUNTER — Other Ambulatory Visit: Payer: Medicare Other

## 2019-09-09 ENCOUNTER — Encounter: Payer: Self-pay | Admitting: Orthopaedic Surgery

## 2019-09-09 ENCOUNTER — Ambulatory Visit: Payer: Medicare Other | Admitting: Orthopaedic Surgery

## 2019-09-09 ENCOUNTER — Other Ambulatory Visit: Payer: Self-pay

## 2019-09-09 VITALS — Ht 67.0 in | Wt 200.0 lb

## 2019-09-09 DIAGNOSIS — M25511 Pain in right shoulder: Secondary | ICD-10-CM | POA: Diagnosis not present

## 2019-09-09 DIAGNOSIS — G8929 Other chronic pain: Secondary | ICD-10-CM

## 2019-09-09 NOTE — Progress Notes (Signed)
Office Visit Note   Patient: [name]           Date of Birth: 05/08/1950           MRN: [id number] Visit Date: 09/09/2019              Requested by: Donald Prose, MD [place],  [place] [id number] PCP: Donald Prose, MD   Assessment & Plan: Visit Diagnoses:  1. Chronic right shoulder pain     Plan: Mr. Milliron had an MRI scan of his right shoulder demonstrating mild tendinosis of the supraspinatus tendon.  There is some degenerative changes at the [facility] joint and very minimal subacromial bursal fluid.  The glenohumeral joint and labrum were intact.  No osseous abnormalities.  Long discussion with Mr. Soulliere regarding all the above.  I discussed treatment options including cortisone injection, physical therapy or over-the-counter medicines.  He is diabetic and notes he does not have any renal issues.  He did rather try the Voltaren gel and hold off on the injections or therapy.  I had like him to keep me in touch over the next 3 to 4 weeks if no improvement  Follow-Up Instructions: Return if symptoms worsen or fail to improve.   Orders:  No orders of the defined types were placed in this encounter.  No orders of the defined types were placed in this encounter.     Procedures: No procedures performed   Clinical Data: No additional findings.   Subjective: Chief Complaint  Patient presents with  . Right Shoulder - Follow-up    MRI review  Patient presents today for follow up on his right shoulder. He had an MRI performed on 08/24/2019 and is here today for those results.  HPI  Review of Systems   Objective: Vital Signs: Ht 5\' 7"  (1.702 m)   Wt 200 lb (90.7 kg)   BMI 31.32 kg/m   Physical Exam Constitutional:      Appearance: He is well-developed.  Eyes:     Pupils: Pupils are equal, round, and reactive to light.  Pulmonary:     Effort: Pulmonary effort is normal.  Skin:    General: Skin is warm and dry.  Neurological:     Mental  Status: He is alert and oriented to person, place, and time.  Psychiatric:        Behavior: Behavior normal.     Ortho Exam awake alert and oriented x3.  Comfortable sitting able to place right arm fully overhead with a circuitous arc of motion.  Positive impingement and empty can testing.  Biceps intact.  Good strength.  Skin intact.  Neurologically intact.  No grating or crepitation.  No pain at the [facility] joint but does have some pain in the anterior lateral subacromial region Specialty Comments:  No specialty comments available.  Imaging: No results found.   PMFS History: Patient Active Problem List   Diagnosis Date Noted  . Pain in right shoulder 08/11/2019  . Essential hypertension 03/19/2018  . Diabetes mellitus type 2, uncontrolled ([place]) 01/06/2014  . Hyperlipidemia 01/06/2014   Past Medical History:  Diagnosis Date  . Anxiety   . Cancer ([place])   . DDD (degenerative disc disease), lumbar   . Diabetes mellitus without complication ([place])    new onset 08-2011  . GERD (gastroesophageal reflux disease)   . Hyperlipidemia     Family History  Problem Relation Age of Onset  . Hypertension Father   .  Prostate cancer Father   . CVA Father   . Diabetes Sister   . Hypertension Mother   . Hyperlipidemia Mother   . CVA Mother   . Diabetes Brother     Past Surgical History:  Procedure Laterality Date  . PROSTATE SURGERY     Social History   Occupational History  . Not on file  Tobacco Use  . Smoking status: Former Research scientist (life sciences)  . Smokeless tobacco: Current User  Substance and Sexual Activity  . Alcohol use: No  . Drug use: No  . Sexual activity: Not on file

## 2020-01-11 DIAGNOSIS — K011 Impacted teeth: Secondary | ICD-10-CM | POA: Diagnosis not present

## 2020-02-15 DIAGNOSIS — E1165 Type 2 diabetes mellitus with hyperglycemia: Secondary | ICD-10-CM | POA: Diagnosis not present

## 2020-02-15 DIAGNOSIS — K219 Gastro-esophageal reflux disease without esophagitis: Secondary | ICD-10-CM | POA: Diagnosis not present

## 2020-02-15 DIAGNOSIS — I10 Essential (primary) hypertension: Secondary | ICD-10-CM | POA: Diagnosis not present

## 2020-02-15 DIAGNOSIS — E1169 Type 2 diabetes mellitus with other specified complication: Secondary | ICD-10-CM | POA: Diagnosis not present

## 2020-02-15 DIAGNOSIS — E785 Hyperlipidemia, unspecified: Secondary | ICD-10-CM | POA: Diagnosis not present

## 2020-03-09 NOTE — Progress Notes (Signed)
Patient is here for follow up visit.  Subjective:   '@Patient'  ID: [name], male    DOB: 12-23-1950, 70 y.o.   MRN: [id number]  Chief Complaint  Patient presents with  . Hypertension  . Follow-up    1 year     HPI  70 y/o [place] American male with hypertension, type 2 DM,  hyperlipidemia, former smoker, h/o prostate cancer s/p prostatectomy, atypical chest pain, low risk stress test 05/2017.  Patient denies chest pain, shortness of breath, palpitations, leg edema, orthopnea, PND, TIA/syncope.  He does yard work maintenance, but does not do any regular physical activity is normal.  Reports no stress lately related symptoms patient is currently pressurization today, but continues group he does not have any minimally, hematochezia, recommend starting insulin.  Morning    Current Outpatient Medications on File Prior to Visit  Medication Sig Dispense Refill  . ALPRAZolam (XANAX) 0.5 MG tablet Take 0.5 mg by mouth at bedtime as needed for anxiety.    Marland Kitchen aspirin 81 MG tablet Take 81 mg by mouth daily.    Marland Kitchen atorvastatin (LIPITOR) 10 MG tablet Take 10 mg by mouth daily.    Marland Kitchen buPROPion (WELLBUTRIN XL) 150 MG 24 hr tablet TAKE 1 TABLET BY MOUTH EVERY DAY 90 tablet 3  . cetirizine (ZYRTEC) 10 MG tablet Take 10 mg by mouth daily.    . cyclobenzaprine (FLEXERIL) 10 MG tablet Take 10 mg by mouth 3 (three) times daily as needed for muscle spasms.    . dapagliflozin propanediol (FARXIGA) 5 MG TABS tablet Take 1 tablet by mouth daily.    . enalapril (VASOTEC) 2.5 MG tablet Take 2.5 mg by mouth daily.    . fluticasone (FLONASE) 50 MCG/ACT nasal spray 1-2 sprays    . meloxicam (MOBIC) 15 MG tablet Take 15 mg by mouth daily.    . metFORMIN (GLUCOPHAGE-XR) 500 MG 24 hr tablet Take 1,000 mg by mouth 2 (two) times daily.    . nitroGLYCERIN (NITROSTAT) 0.4 MG SL tablet TAKE 1 TABLET BY MOUTH AS DIRECTED 25 tablet 3  . pantoprazole (PROTONIX) 40 MG tablet Take 40 mg by mouth daily.     No  current facility-administered medications on file prior to visit.    Cardiovascular studies:  EKG 03/19/2019: Sinus rhythm 99 bpm. Possible old anteroseptal infarct.  Exercise myoview stress 05/16/2017: 1. The patient performed treadmill exercise using a Modified Bruce protocol, completing 5 minutes. The patient completed an estimated workload of 5.9 METS, reaching 93% of the maximum predicted heart rate. Low exercise capacity. Stress symptoms included leg fatigue. Resting hypertension 154/92 mmHg with normal hemodynamic response with peak BP 174/80 mmHg. No ischemic changes seen on stress electrocardiogram. 2. The overall quality of the study is excellent. There is no evidence of abnormal lung activity. Stress and rest SPECT images demonstrate homogeneous tracer distribution throughout the myocardium. Gated SPECT imaging reveals normal myocardial thickening and wall motion. The left ventricular ejection fraction was normal (51%). 3. Low risk study.   Recent labs: 09/28/2019: Glucose 174, BUN/Cr 13/1.0. EGFR 70. HbA1C 7.7% Chol 156, TG 221, HDL 47, LDL 73 TSH 1.0 normal  08/19/2018: Glucose 105, BUN/Cr 13/1.0.  HbA1C 8.0% Chol 163, TG 179, HDL 51, LDL 77 TSH 1.0 normal  04/17/2017:  H/H 13.3/40.6. MCV 83. Platelets 253 Glucose 142. BN/Cr 9/1.02. eGFR normal. Na/K 140/4.0  Review of Systems  Cardiovascular: Positive for dyspnea on exertion (Mild, chronic, stable). Negative for chest pain, leg swelling, palpitations and syncope.  Objective:    Vitals:   03/10/20 0834  BP: (!) 156/82  Pulse: (!) 55  Resp: 16  Temp: 98.4 F (36.9 C)  SpO2: 96%     Physical Exam Vitals and nursing note reviewed.  Constitutional:      Appearance: He is well-developed.  Neck:     Vascular: No JVD.  Cardiovascular:     Rate and Rhythm: Normal rate and regular rhythm.     Pulses: Intact distal pulses.     Heart sounds: Normal heart sounds. No murmur heard.   Pulmonary:     Effort:  Pulmonary effort is normal.     Breath sounds: Normal breath sounds. No wheezing or rales.  Musculoskeletal:     Comments: Clubbing in fingers of both hands         Assessment & Recommendations:   70 y/o [place] male with hypertension, type 2 DM,  hyperlipidemia, former smoker, h/o prostate cancer s/p prostatectomy, atypical chest pain, low risk stress test 05/2017.  Hypertension:  Blood pressure better today.  He attributes it to stress.  He is reluctant to make any changes today.  Recommend keeping home log, and discuss with PCP.  If blood pressures remains elevated, could add amlodipine 5 mg daily.  Prior chest pain: Currently resolved. Low risk stress test 2019.  Type 2 DM: Continue follow up with PCP. Continue lipitor.  In absence of bleeding, okay to continue aspirin for primary prevention.   F/u in 1 year  Nigel Mormon, MD [facility] Pager: [phone] Office: [phone] If no answer Cell [phone]

## 2020-03-10 ENCOUNTER — Other Ambulatory Visit: Payer: Self-pay

## 2020-03-10 ENCOUNTER — Ambulatory Visit: Payer: Medicare Other | Admitting: Cardiology

## 2020-03-10 ENCOUNTER — Encounter: Payer: Self-pay | Admitting: Cardiology

## 2020-03-10 VITALS — BP 156/82 | HR 55 | Temp 98.4°F | Resp 16 | Ht 67.0 in | Wt 202.2 lb

## 2020-03-10 DIAGNOSIS — E782 Mixed hyperlipidemia: Secondary | ICD-10-CM

## 2020-03-10 DIAGNOSIS — I10 Essential (primary) hypertension: Secondary | ICD-10-CM | POA: Diagnosis not present

## 2020-03-28 DIAGNOSIS — Z7984 Long term (current) use of oral hypoglycemic drugs: Secondary | ICD-10-CM | POA: Diagnosis not present

## 2020-03-28 DIAGNOSIS — E785 Hyperlipidemia, unspecified: Secondary | ICD-10-CM | POA: Diagnosis not present

## 2020-03-28 DIAGNOSIS — I10 Essential (primary) hypertension: Secondary | ICD-10-CM | POA: Diagnosis not present

## 2020-03-28 DIAGNOSIS — E1169 Type 2 diabetes mellitus with other specified complication: Secondary | ICD-10-CM | POA: Diagnosis not present

## 2020-03-28 DIAGNOSIS — K219 Gastro-esophageal reflux disease without esophagitis: Secondary | ICD-10-CM | POA: Diagnosis not present

## 2020-03-28 DIAGNOSIS — I700 Atherosclerosis of aorta: Secondary | ICD-10-CM | POA: Diagnosis not present

## 2020-05-02 DIAGNOSIS — I10 Essential (primary) hypertension: Secondary | ICD-10-CM | POA: Diagnosis not present

## 2020-05-02 DIAGNOSIS — E1169 Type 2 diabetes mellitus with other specified complication: Secondary | ICD-10-CM | POA: Diagnosis not present

## 2020-05-02 DIAGNOSIS — E785 Hyperlipidemia, unspecified: Secondary | ICD-10-CM | POA: Diagnosis not present

## 2020-05-02 DIAGNOSIS — K219 Gastro-esophageal reflux disease without esophagitis: Secondary | ICD-10-CM | POA: Diagnosis not present

## 2020-05-02 DIAGNOSIS — E1165 Type 2 diabetes mellitus with hyperglycemia: Secondary | ICD-10-CM | POA: Diagnosis not present

## 2020-05-12 DIAGNOSIS — K219 Gastro-esophageal reflux disease without esophagitis: Secondary | ICD-10-CM | POA: Diagnosis not present

## 2020-05-12 DIAGNOSIS — I10 Essential (primary) hypertension: Secondary | ICD-10-CM | POA: Diagnosis not present

## 2020-05-12 DIAGNOSIS — E785 Hyperlipidemia, unspecified: Secondary | ICD-10-CM | POA: Diagnosis not present

## 2020-05-12 DIAGNOSIS — E1169 Type 2 diabetes mellitus with other specified complication: Secondary | ICD-10-CM | POA: Diagnosis not present

## 2020-05-12 DIAGNOSIS — E1165 Type 2 diabetes mellitus with hyperglycemia: Secondary | ICD-10-CM | POA: Diagnosis not present

## 2020-05-16 DIAGNOSIS — E119 Type 2 diabetes mellitus without complications: Secondary | ICD-10-CM | POA: Diagnosis not present

## 2020-05-19 DIAGNOSIS — R197 Diarrhea, unspecified: Secondary | ICD-10-CM | POA: Diagnosis not present

## 2020-05-19 DIAGNOSIS — R1013 Epigastric pain: Secondary | ICD-10-CM | POA: Diagnosis not present

## 2020-10-03 DIAGNOSIS — E785 Hyperlipidemia, unspecified: Secondary | ICD-10-CM | POA: Diagnosis not present

## 2020-10-03 DIAGNOSIS — E1165 Type 2 diabetes mellitus with hyperglycemia: Secondary | ICD-10-CM | POA: Diagnosis not present

## 2020-10-03 DIAGNOSIS — I10 Essential (primary) hypertension: Secondary | ICD-10-CM | POA: Diagnosis not present

## 2020-10-03 DIAGNOSIS — K219 Gastro-esophageal reflux disease without esophagitis: Secondary | ICD-10-CM | POA: Diagnosis not present

## 2020-10-03 DIAGNOSIS — E1169 Type 2 diabetes mellitus with other specified complication: Secondary | ICD-10-CM | POA: Diagnosis not present

## 2020-10-13 DIAGNOSIS — Z0000 Encounter for general adult medical examination without abnormal findings: Secondary | ICD-10-CM | POA: Diagnosis not present

## 2020-10-13 DIAGNOSIS — E1169 Type 2 diabetes mellitus with other specified complication: Secondary | ICD-10-CM | POA: Diagnosis not present

## 2020-10-13 DIAGNOSIS — I700 Atherosclerosis of aorta: Secondary | ICD-10-CM | POA: Diagnosis not present

## 2020-10-13 DIAGNOSIS — Z1389 Encounter for screening for other disorder: Secondary | ICD-10-CM | POA: Diagnosis not present

## 2020-10-13 DIAGNOSIS — E785 Hyperlipidemia, unspecified: Secondary | ICD-10-CM | POA: Diagnosis not present

## 2020-10-13 DIAGNOSIS — Z7984 Long term (current) use of oral hypoglycemic drugs: Secondary | ICD-10-CM | POA: Diagnosis not present

## 2020-10-13 DIAGNOSIS — D3501 Benign neoplasm of right adrenal gland: Secondary | ICD-10-CM | POA: Diagnosis not present

## 2021-01-24 DIAGNOSIS — E1169 Type 2 diabetes mellitus with other specified complication: Secondary | ICD-10-CM | POA: Diagnosis not present

## 2021-01-24 DIAGNOSIS — E785 Hyperlipidemia, unspecified: Secondary | ICD-10-CM | POA: Diagnosis not present

## 2021-01-24 DIAGNOSIS — K219 Gastro-esophageal reflux disease without esophagitis: Secondary | ICD-10-CM | POA: Diagnosis not present

## 2021-01-24 DIAGNOSIS — E1165 Type 2 diabetes mellitus with hyperglycemia: Secondary | ICD-10-CM | POA: Diagnosis not present

## 2021-03-12 ENCOUNTER — Other Ambulatory Visit: Payer: Self-pay

## 2021-03-12 ENCOUNTER — Ambulatory Visit: Payer: Medicare Other | Admitting: Cardiology

## 2021-03-12 ENCOUNTER — Encounter: Payer: Self-pay | Admitting: Cardiology

## 2021-03-12 VITALS — BP 119/80 | HR 102 | Temp 97.5°F | Resp 17 | Ht 67.0 in | Wt 195.8 lb

## 2021-03-12 DIAGNOSIS — I10 Essential (primary) hypertension: Secondary | ICD-10-CM

## 2021-03-12 DIAGNOSIS — E782 Mixed hyperlipidemia: Secondary | ICD-10-CM | POA: Diagnosis not present

## 2021-03-12 NOTE — Progress Notes (Signed)
? ? ?Patient is here for follow up visit. ? ?Subjective:  ? ?'@Patient'  ID: [name], male    DOB: 1950-08-28, 71 y.o.   MRN: [id number] ? ?Chief Complaint  ?Patient presents with  ? Hypertension  ?  1 YEAR  ? ? ? ?HPI ? ?71 y/o [place] male with hypertension, type 2 DM,  hyperlipidemia, former smoker, h/o prostate cancer s/p prostatectomy ? ?Patient is doing well. 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 He has gained weight through winter but is hoping to lose now that he is walking more. Blood pressure is fairly well controlled today. ? ? ?Current Outpatient Medications:  ?  ALPRAZolam (XANAX) 0.5 MG tablet, Take 0.5 mg by mouth at bedtime as needed for anxiety., Disp: , Rfl:  ?  aspirin 81 MG tablet, Take 81 mg by mouth daily., Disp: , Rfl:  ?  atorvastatin (LIPITOR) 10 MG tablet, Take 10 mg by mouth daily., Disp: , Rfl:  ?  buPROPion (WELLBUTRIN XL) 150 MG 24 hr tablet, TAKE 1 TABLET BY MOUTH EVERY DAY, Disp: 90 tablet, Rfl: 3 ?  cetirizine (ZYRTEC) 10 MG tablet, Take 10 mg by mouth daily., Disp: , Rfl:  ?  cyclobenzaprine (FLEXERIL) 10 MG tablet, Take 10 mg by mouth 3 (three) times daily as needed for muscle spasms., Disp: , Rfl:  ?  dapagliflozin propanediol (FARXIGA) 5 MG TABS tablet, Take 1 tablet by mouth daily., Disp: , Rfl:  ?  enalapril (VASOTEC) 2.5 MG tablet, Take 2.5 mg by mouth daily., Disp: , Rfl:  ?  fluticasone (FLONASE) 50 MCG/ACT nasal spray, 1-2 sprays, Disp: , Rfl:  ?  metFORMIN (GLUCOPHAGE-XR) 500 MG 24 hr tablet, Take 1,000 mg by mouth 2 (two) times daily., Disp: , Rfl:  ?  nitroGLYCERIN (NITROSTAT) 0.4 MG SL tablet, TAKE 1 TABLET BY MOUTH AS DIRECTED, Disp: 25 tablet, Rfl: 3 ?  pantoprazole (PROTONIX) 40 MG tablet, Take 40 mg by mouth daily., Disp: , Rfl:  ? ? ? ?Cardiovascular studies: ? ?EKG 03/12/2021: ?Sinus tachycardia 100 bpm ?Low voltage ?Nonspecific T wave abnormality ? ?Exercise myoview stress 05/16/2017: ?1. The patient  performed treadmill exercise using a Modified Bruce protocol, completing 5 minutes. The patient completed an estimated workload of 5.9 METS, reaching 93% of the maximum predicted heart rate. Low exercise capacity. Stress symptoms included leg fatigue. Resting hypertension 154/92 mmHg with normal hemodynamic response with peak BP 174/80 mmHg. No ischemic changes seen on stress electrocardiogram. 2. The overall quality of the study is excellent. There is no evidence of abnormal lung activity. Stress and rest SPECT images demonstrate homogeneous tracer distribution throughout the myocardium. Gated SPECT imaging reveals normal myocardial thickening and wall motion. The left ventricular ejection fraction was normal (51%). 3. Low risk study. ? ? ?Recent labs: ?10/13/2020: ?Glucose 146, BUN/Cr 14/0.9. EGFR 85 K 4.3 ?HbA1C 6.7% ?Chol 155, TG 139, HDL 54, LDL 77 ?TSH 1.0 normal ? ?09/28/2019: ?Glucose 174, BUN/Cr 13/1.0. EGFR 70. ?HbA1C 7.7% ?Chol 156, TG 221, HDL 47, LDL 73 ?TSH 1.0 normal ? ?08/19/2018: ?Glucose 105, BUN/Cr 13/1.0.  ?HbA1C 8.0% ?Chol 163, TG 179, HDL 51, LDL 77 ?TSH 1.0 normal ? ?04/17/2017:  ?H/H 13.3/40.6. MCV 83. Platelets 253 Glucose 142. BN/Cr 9/1.02. eGFR normal. Na/K 140/4.0 ? ?Review of Systems  ?Cardiovascular:  Negative for chest pain, dyspnea on exertion, leg swelling, palpitations and syncope.  ?Neurological:  Positive for numbness (Great toe numbness).  ? ?   ?Objective:  ? ? ?  Vitals:  ? 03/12/21 0825 03/12/21 3887  ?BP: (!) 144/81 119/80  ?Pulse: (!) 112 (!) 102  ?Resp: 17   ?Temp: (!) 97.5 ?F (36.4 ?C)   ?SpO2: 98% 97%  ? ? ? Physical Exam ?Vitals and nursing note reviewed.  ?Constitutional:   ?   Appearance: He is well-developed.  ?Neck:  ?   Vascular: No JVD.  ?Cardiovascular:  ?   Rate and Rhythm: Normal rate and regular rhythm.  ?   Pulses: Normal pulses and intact distal pulses.  ?   Heart sounds: Normal heart sounds. No murmur heard. ?Pulmonary:  ?   Effort: Pulmonary effort is normal.  ?    Breath sounds: Normal breath sounds. No wheezing or rales.  ?Musculoskeletal:  ?   Right lower leg: No edema.  ?   Left lower leg: No edema.  ?   Comments: Clubbing in fingers of both hands  ? ?  ICD-10-CM   ?1. Essential hypertension  I10 EKG 12-Lead  ?  ?2. Mixed hyperlipidemia  E78.2   ?  ? ? ?   ?Assessment & Recommendations:  ? ?71 y/o [place] male with hypertension, type 2 DM,  hyperlipidemia, former smoker, h/o prostate cancer s/p prostatectomy, atypical chest pain ? ?Hypertension: ?Fairly well controlled ? ?Mixed hyperilipdemia: ?Lipids well controlled on Lipitor 10 mg.  ? ?Type 2 DM: ?Continue follow up with PCP. Continue lipitor.  ?In absence of bleeding, okay to continue aspirin for primary prevention.  ?F/u w/PCP re: DM management and possible neuropathy ? ?F/u in 1 year ? ?Nigel Mormon, MD ?[facility] ?Pager: [phone] ?Office: [phone] ?If no answer Cell [phone] ?

## 2021-04-17 DIAGNOSIS — I700 Atherosclerosis of aorta: Secondary | ICD-10-CM | POA: Diagnosis not present

## 2021-04-17 DIAGNOSIS — F17210 Nicotine dependence, cigarettes, uncomplicated: Secondary | ICD-10-CM | POA: Diagnosis not present

## 2021-04-17 DIAGNOSIS — E785 Hyperlipidemia, unspecified: Secondary | ICD-10-CM | POA: Diagnosis not present

## 2021-04-17 DIAGNOSIS — K219 Gastro-esophageal reflux disease without esophagitis: Secondary | ICD-10-CM | POA: Diagnosis not present

## 2021-04-17 DIAGNOSIS — E1169 Type 2 diabetes mellitus with other specified complication: Secondary | ICD-10-CM | POA: Diagnosis not present

## 2021-06-01 DIAGNOSIS — E119 Type 2 diabetes mellitus without complications: Secondary | ICD-10-CM | POA: Diagnosis not present

## 2021-08-13 DIAGNOSIS — E1165 Type 2 diabetes mellitus with hyperglycemia: Secondary | ICD-10-CM | POA: Diagnosis not present

## 2021-08-13 DIAGNOSIS — F17210 Nicotine dependence, cigarettes, uncomplicated: Secondary | ICD-10-CM | POA: Diagnosis not present

## 2021-08-13 DIAGNOSIS — S20212A Contusion of left front wall of thorax, initial encounter: Secondary | ICD-10-CM | POA: Diagnosis not present

## 2021-09-03 ENCOUNTER — Ambulatory Visit
Admission: RE | Admit: 2021-09-03 | Discharge: 2021-09-03 | Disposition: A | Discharge status: Home / Self Care | Payer: Medicare Other | Source: Ambulatory Visit | Attending: Family Medicine | Admitting: Family Medicine

## 2021-09-03 ENCOUNTER — Other Ambulatory Visit: Payer: Self-pay | Admitting: Family Medicine

## 2021-09-03 DIAGNOSIS — S20212A Contusion of left front wall of thorax, initial encounter: Secondary | ICD-10-CM | POA: Diagnosis not present

## 2021-11-14 DIAGNOSIS — E1169 Type 2 diabetes mellitus with other specified complication: Secondary | ICD-10-CM | POA: Diagnosis not present

## 2021-11-14 DIAGNOSIS — F17210 Nicotine dependence, cigarettes, uncomplicated: Secondary | ICD-10-CM | POA: Diagnosis not present

## 2021-11-14 DIAGNOSIS — H6123 Impacted cerumen, bilateral: Secondary | ICD-10-CM | POA: Diagnosis not present

## 2021-11-14 DIAGNOSIS — E785 Hyperlipidemia, unspecified: Secondary | ICD-10-CM | POA: Diagnosis not present

## 2021-11-14 DIAGNOSIS — Z0000 Encounter for general adult medical examination without abnormal findings: Secondary | ICD-10-CM | POA: Diagnosis not present

## 2021-11-14 DIAGNOSIS — I700 Atherosclerosis of aorta: Secondary | ICD-10-CM | POA: Diagnosis not present

## 2021-11-14 DIAGNOSIS — D3501 Benign neoplasm of right adrenal gland: Secondary | ICD-10-CM | POA: Diagnosis not present

## 2021-11-20 DIAGNOSIS — H10013 Acute follicular conjunctivitis, bilateral: Secondary | ICD-10-CM | POA: Diagnosis not present

## 2021-11-28 DIAGNOSIS — H10013 Acute follicular conjunctivitis, bilateral: Secondary | ICD-10-CM | POA: Diagnosis not present

## 2022-03-12 NOTE — Progress Notes (Unsigned)
Patient is here for follow up visit.  Subjective:   '@Patient'$  ID: [name], male    DOB: 26-Mar-1950, 72 y.o.   MRN: [id number]  No chief complaint on file.    HPI  72 y/o [place] American male with hypertension, type 2 DM,  hyperlipidemia, former smoker, h/o prostate cancer s/p prostatectomy  ***Patient is doing well. 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 He has gained weight through winter but is hoping to lose now that he is walking more. Blood pressure is fairly well controlled today.   Current Outpatient Medications:    ALPRAZolam (XANAX) 0.5 MG tablet, Take 0.5 mg by mouth at bedtime as needed for anxiety., Disp: , Rfl:    aspirin 81 MG tablet, Take 81 mg by mouth daily., Disp: , Rfl:    atorvastatin (LIPITOR) 10 MG tablet, Take 10 mg by mouth daily., Disp: , Rfl:    buPROPion (WELLBUTRIN XL) 150 MG 24 hr tablet, TAKE 1 TABLET BY MOUTH EVERY DAY, Disp: 90 tablet, Rfl: 3   cetirizine (ZYRTEC) 10 MG tablet, Take 10 mg by mouth daily., Disp: , Rfl:    enalapril (VASOTEC) 2.5 MG tablet, Take 2.5 mg by mouth daily., Disp: , Rfl:    fluticasone (FLONASE) 50 MCG/ACT nasal spray, 1-2 sprays, Disp: , Rfl:    ibuprofen (ADVIL) 600 MG tablet, Take 600 mg by mouth., Disp: , Rfl:    JARDIANCE 25 MG TABS tablet, Take 25 mg by mouth daily., Disp: , Rfl:    metFORMIN (GLUCOPHAGE-XR) 500 MG 24 hr tablet, Take 1,000 mg by mouth 2 (two) times daily., Disp: , Rfl:    nitroGLYCERIN (NITROSTAT) 0.4 MG SL tablet, TAKE 1 TABLET BY MOUTH AS DIRECTED (Patient not taking: Reported on 03/12/2021), Disp: 25 tablet, Rfl: 3   pantoprazole (PROTONIX) 40 MG tablet, Take 40 mg by mouth daily., Disp: , Rfl:     Cardiovascular studies:  EKG 03/12/2021: Sinus tachycardia 100 bpm Low voltage Nonspecific T wave abnormality  Exercise myoview stress 05/16/2017: 1. The patient performed treadmill exercise using a Modified Bruce protocol, completing 5  minutes. The patient completed an estimated workload of 5.9 METS, reaching 93% of the maximum predicted heart rate. Low exercise capacity. Stress symptoms included leg fatigue. Resting hypertension 154/92 mmHg with normal hemodynamic response with peak BP 174/80 mmHg. No ischemic changes seen on stress electrocardiogram. 2. The overall quality of the study is excellent. There is no evidence of abnormal lung activity. Stress and rest SPECT images demonstrate homogeneous tracer distribution throughout the myocardium. Gated SPECT imaging reveals normal myocardial thickening and wall motion. The left ventricular ejection fraction was normal (51%). 3. Low risk study.   Recent labs: 10/13/2020: Glucose 146, BUN/Cr 14/0.9. EGFR 85 K 4.3 HbA1C 6.7% Chol 155, TG 139, HDL 54, LDL 77 TSH 1.0 normal  09/28/2019: Glucose 174, BUN/Cr 13/1.0. EGFR 70. HbA1C 7.7% Chol 156, TG 221, HDL 47, LDL 73 TSH 1.0 normal  08/19/2018: Glucose 105, BUN/Cr 13/1.0.  HbA1C 8.0% Chol 163, TG 179, HDL 51, LDL 77 TSH 1.0 normal  04/17/2017:  H/H 13.3/40.6. MCV 83. Platelets 253 Glucose 142. BN/Cr 9/1.02. eGFR normal. Na/K 140/4.0  Review of Systems  Cardiovascular:  Negative for chest pain, dyspnea on exertion, leg swelling, palpitations and syncope.  Neurological:  Positive for numbness (Great toe numbness).       Objective:    There were no vitals filed for this visit.    Physical  Exam Vitals and nursing note reviewed.  Constitutional:      Appearance: He is well-developed.  Neck:     Vascular: No JVD.  Cardiovascular:     Rate and Rhythm: Normal rate and regular rhythm.     Pulses: Normal pulses and intact distal pulses.     Heart sounds: Normal heart sounds. No murmur heard. Pulmonary:     Effort: Pulmonary effort is normal.     Breath sounds: Normal breath sounds. No wheezing or rales.  Musculoskeletal:     Right lower leg: No edema.     Left lower leg: No edema.     Comments: Clubbing in fingers  of both hands    No diagnosis found.      Assessment & Recommendations:   72 y/o [place] American male with hypertension, type 2 DM,  hyperlipidemia, former smoker, h/o prostate cancer s/p prostatectomy, atypical chest pain  ***Hypertension: Fairly well controlled  ***Mixed hyperilipdemia: Lipids well controlled on Lipitor 10 mg.   ***Type 2 DM: Continue follow up with PCP. Continue lipitor.  In absence of bleeding, okay to continue aspirin for primary prevention.  F/u w/PCP re: DM management and possible neuropathy  F/u in 1 year   Nigel Mormon, MD Pager: [phone] Office: [phone]

## 2022-03-13 ENCOUNTER — Encounter: Payer: Self-pay | Admitting: Cardiology

## 2022-03-13 ENCOUNTER — Ambulatory Visit: Payer: Medicare Other | Admitting: Cardiology

## 2022-03-13 VITALS — BP 118/81 | HR 69 | Resp 16 | Ht 67.0 in | Wt 197.0 lb

## 2022-03-13 DIAGNOSIS — E782 Mixed hyperlipidemia: Secondary | ICD-10-CM | POA: Diagnosis not present

## 2022-03-13 DIAGNOSIS — I10 Essential (primary) hypertension: Secondary | ICD-10-CM

## 2022-03-13 MED ORDER — ATORVASTATIN CALCIUM 20 MG PO TABS: 20.0000 mg | ORAL_TABLET | Freq: Every day | ORAL | 3 refills | Status: DC

## 2022-03-13 MED ORDER — NICOTINE POLACRILEX 2 MG MT GUM: 2.0000 mg | CHEWING_GUM | OROMUCOSAL | 0 refills | Status: DC | PRN

## 2022-05-12 IMAGING — MR MR SHOULDER*R* W/O CM
4 of 5 series · 15 of 40 positions shown · non-contrast
Comparison: Right shoulder x-rays dated August 11, 2019.

CLINICAL DATA: Chronic right shoulder pain. No injury or prior
surgery.

EXAM:
MRI OF THE RIGHT SHOULDER WITHOUT CONTRAST
TECHNIQUE: Multiplanar, multisequence MR imaging of the shoulder was performed.
No intravenous contrast was administered.

[Series 6: PD fat-sat · axial · right · 4.0mm · 0.22mm/px · z∈[-35,+41]mm · 6 of 22 slices shown (1 of 2)]
[im 1/22]
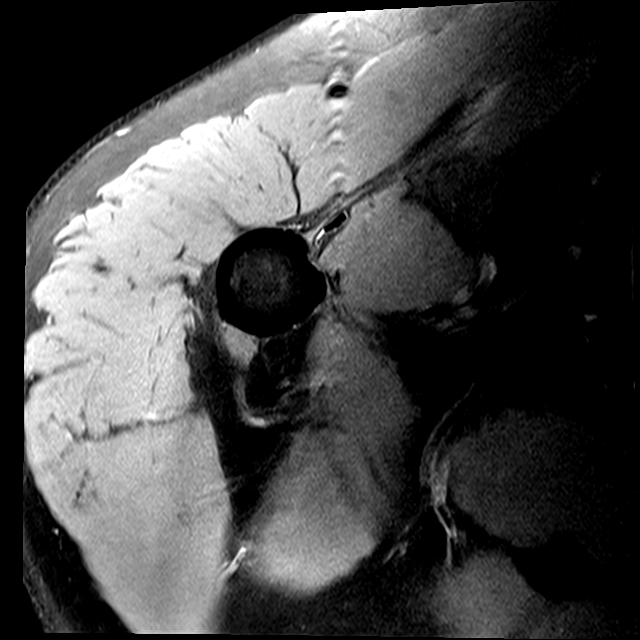
[im 4/22]
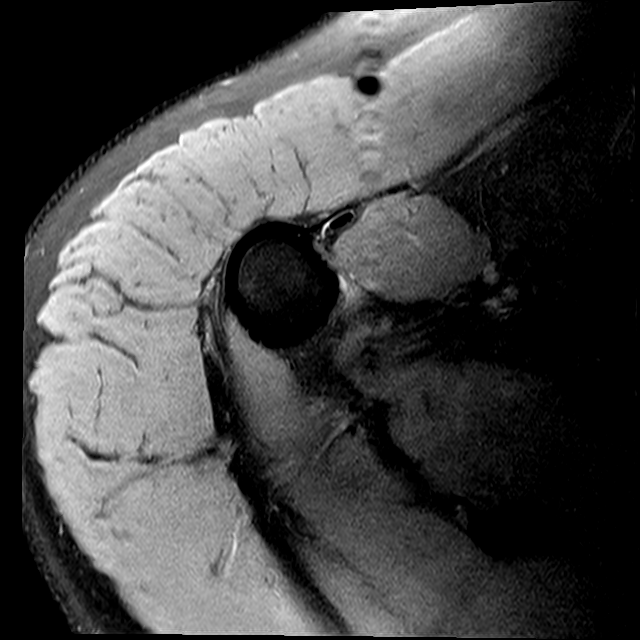
[im 7/22]
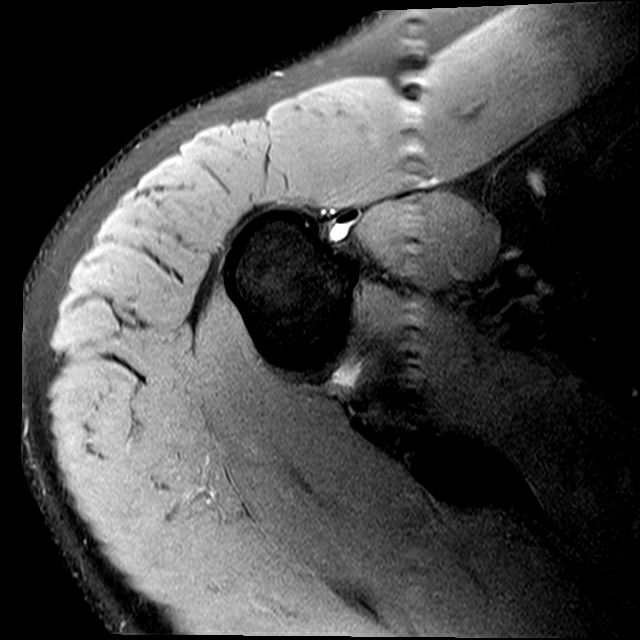
[im 10/22]
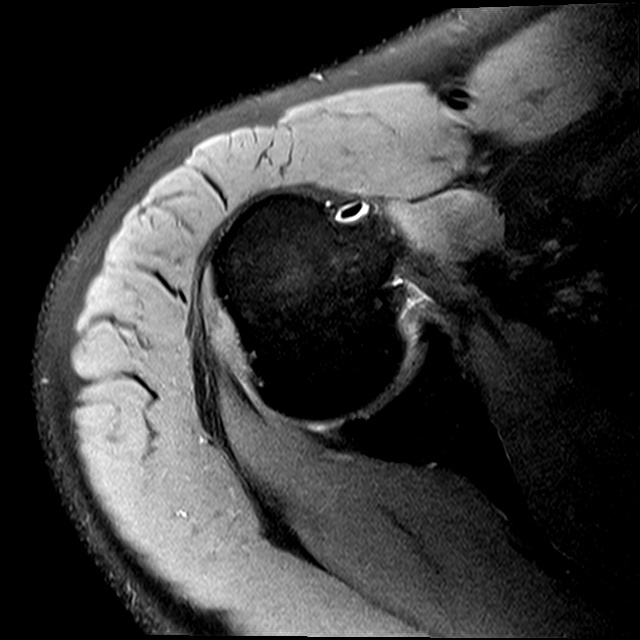
[im 13/22]
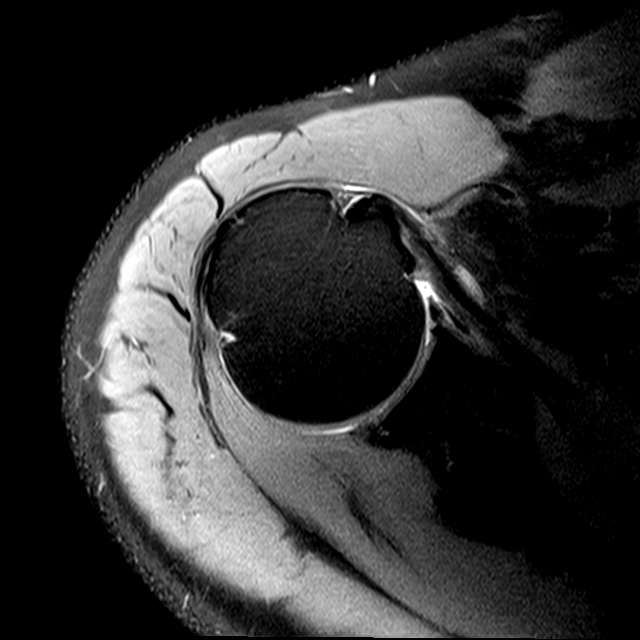
[im 19/22]
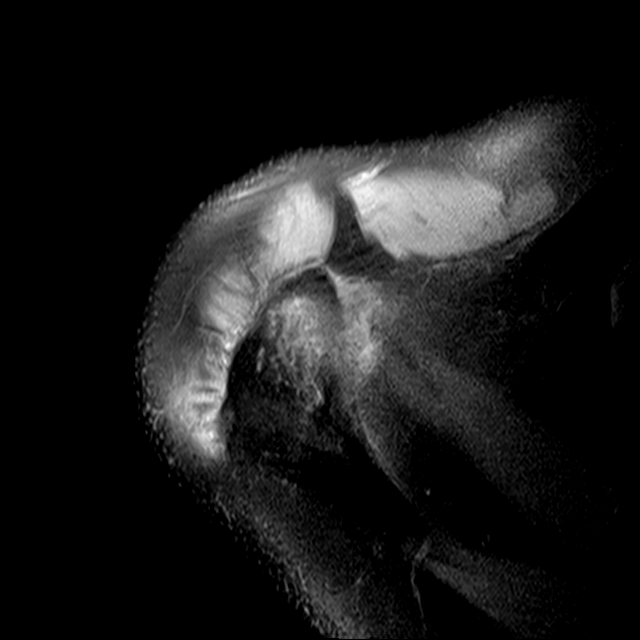

[Series 8: T2 fat-sat · sagittal · right · 4.0mm · 0.22mm/px · 3 of 21 slices shown (1 of 2)]
[im 3/21]
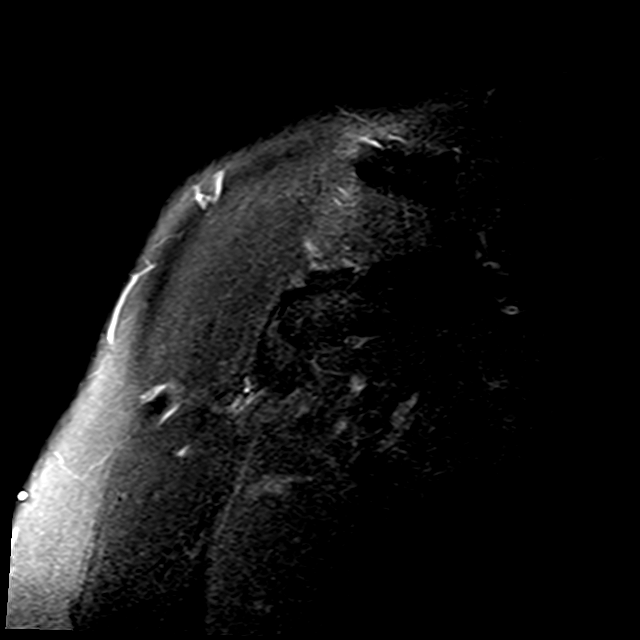
[im 12/21]
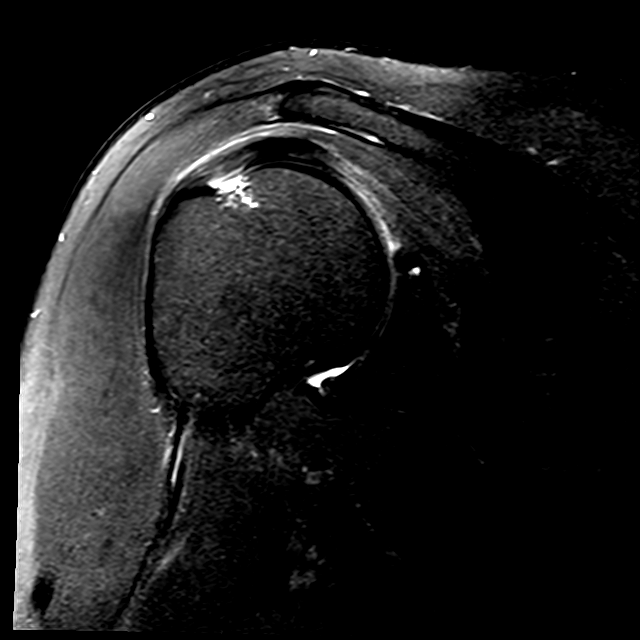
[im 18/21]
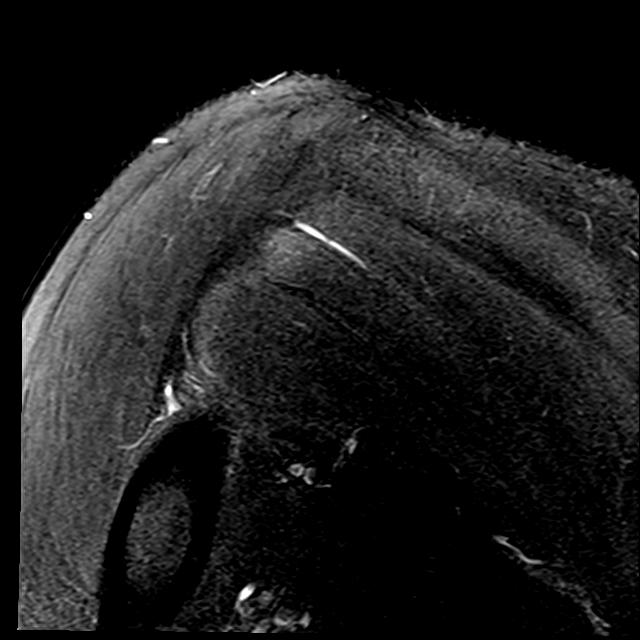

[Series 9: T2 fat-sat · coronal · right · 4.0mm · 0.44mm/px · 3 of 23 slices shown (2 of 2)]
[im 4/23]
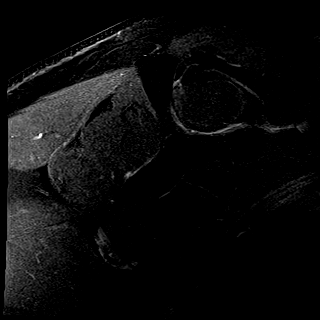
[im 13/23]
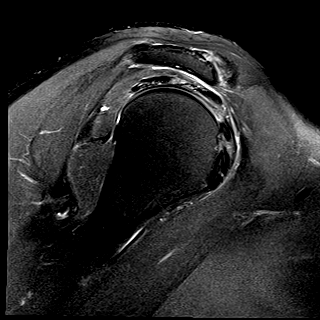
[im 19/23]
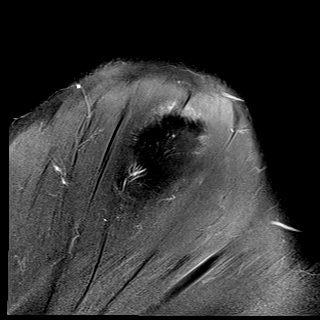

[Series 11: PD fat-sat · sagittal · right · 4.0mm · 0.22mm/px · 3 of 21 slices shown (2 of 2)]
[im 3/21]
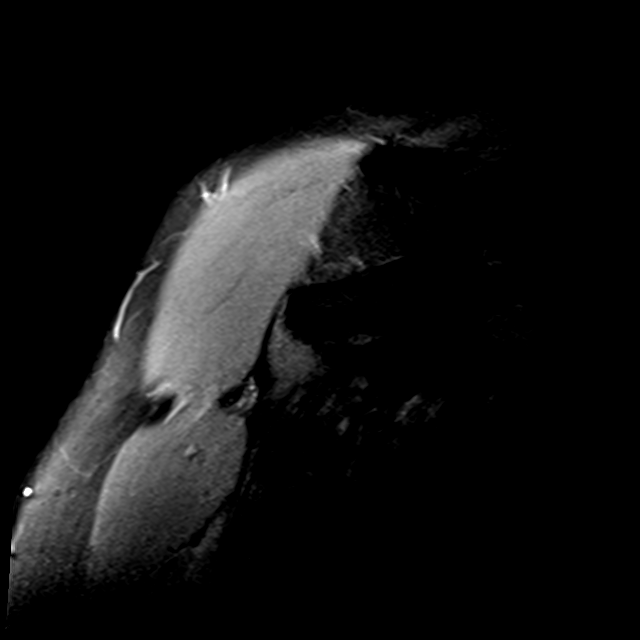
[im 12/21]
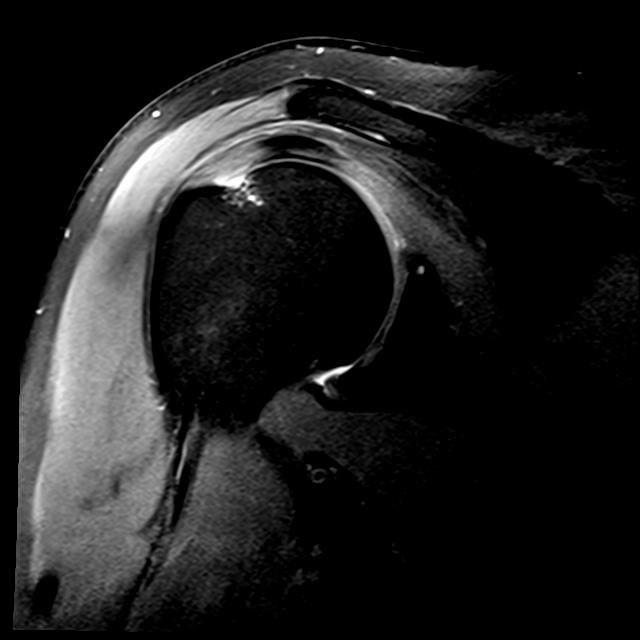
[im 18/21]
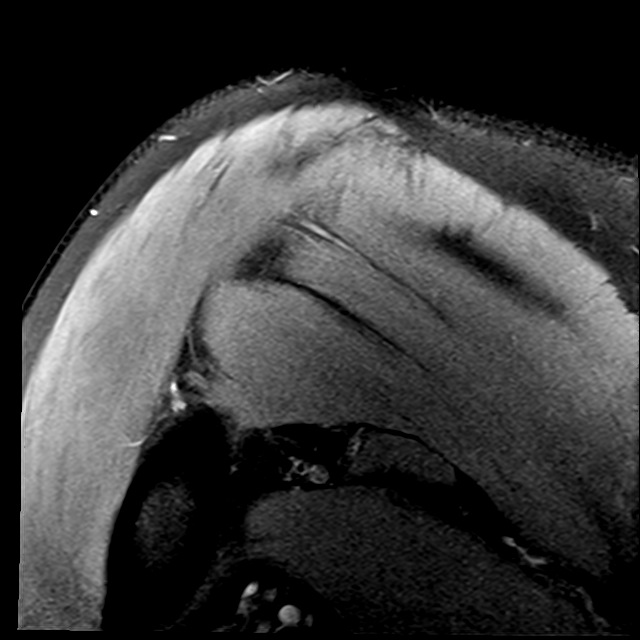

[15 of 40 positions shown; findings below may reference images not displayed]

FINDINGS: Rotator cuff: Mild supraspinatus tendinosis with low-grade
partial-thickness articular surface tear distally. The
infraspinatus, teres minor, and subscapularis tendons are intact.

Muscles: No atrophy or abnormal signal of the muscles of the rotator
cuff.

Biceps long head:  Intact and normally positioned.

Acromioclavicular Joint: Mild arthropathy of the acromioclavicular
joint. Type II acromion. Trace subacromial/subdeltoid bursal fluid.

Glenohumeral Joint: No significant joint effusion. No chondral
defect.

Labrum: Grossly intact, but evaluation is limited by lack of
intraarticular fluid.

Bones: No acute fracture or dislocation. No suspicious bone lesion.

Other: None.
IMPRESSION: 1. Mild supraspinatus tendinosis with low-grade partial-thickness
articular surface tear distally.
2. Mild acromioclavicular osteoarthritis.

## 2022-05-15 DIAGNOSIS — I700 Atherosclerosis of aorta: Secondary | ICD-10-CM | POA: Diagnosis not present

## 2022-05-15 DIAGNOSIS — F17210 Nicotine dependence, cigarettes, uncomplicated: Secondary | ICD-10-CM | POA: Diagnosis not present

## 2022-05-15 DIAGNOSIS — E785 Hyperlipidemia, unspecified: Secondary | ICD-10-CM | POA: Diagnosis not present

## 2022-05-15 DIAGNOSIS — K219 Gastro-esophageal reflux disease without esophagitis: Secondary | ICD-10-CM | POA: Diagnosis not present

## 2022-05-15 DIAGNOSIS — E1169 Type 2 diabetes mellitus with other specified complication: Secondary | ICD-10-CM | POA: Diagnosis not present

## 2022-06-13 ENCOUNTER — Encounter: Payer: Self-pay | Admitting: Cardiology

## 2022-06-13 ENCOUNTER — Ambulatory Visit: Payer: Medicare Other | Admitting: Cardiology

## 2022-06-13 VITALS — BP 124/76 | HR 90 | Ht 67.0 in | Wt 190.0 lb

## 2022-06-13 DIAGNOSIS — F17210 Nicotine dependence, cigarettes, uncomplicated: Secondary | ICD-10-CM

## 2022-06-13 DIAGNOSIS — I10 Essential (primary) hypertension: Secondary | ICD-10-CM | POA: Diagnosis not present

## 2022-06-13 DIAGNOSIS — E782 Mixed hyperlipidemia: Secondary | ICD-10-CM

## 2022-06-13 MED ORDER — ATORVASTATIN CALCIUM 20 MG PO TABS: 20.0000 mg | ORAL_TABLET | Freq: Every day | ORAL | 3 refills | Status: DC

## 2022-06-13 NOTE — Progress Notes (Signed)
Patient is here for follow up visit.  Subjective:   @Patient  ID: [name], male    DOB: March 11, 1950, 72 y.o.   MRN: [id number]  Chief Complaint  Patient presents with   Essential hypertension   Follow-up     HPI  72 y/o [place] American male with hypertension, type 2 DM,  hyperlipidemia, smoker, h/o prostate cancer s/p prostatectomy  Patient is doing well, has no complaints. Reviewed recent test results with the patient, details below.  He tried quitting smoking "cold [place]", but it did not work.  He smokes 3-5 cigarettes", but does not smoke when at home.  He started several medication options, but have not worked in the past.      Current Outpatient Medications:    ALPRAZolam (XANAX) 0.5 MG tablet, Take 0.5 mg by mouth 2 (two) times daily as needed for anxiety., Disp: , Rfl:    aspirin 81 MG tablet, Take 81 mg by mouth daily., Disp: , Rfl:    atorvastatin (LIPITOR) 20 MG tablet, Take 1 tablet (20 mg total) by mouth daily., Disp: 90 tablet, Rfl: 3   buPROPion (WELLBUTRIN XL) 150 MG 24 hr tablet, TAKE 1 TABLET BY MOUTH EVERY DAY, Disp: 90 tablet, Rfl: 3   cetirizine (ZYRTEC) 10 MG tablet, Take 10 mg by mouth daily., Disp: , Rfl:    enalapril (VASOTEC) 2.5 MG tablet, Take 2.5 mg by mouth daily., Disp: , Rfl:    fluticasone (FLONASE) 50 MCG/ACT nasal spray, 1-2 sprays, Disp: , Rfl:    ibuprofen (ADVIL) 600 MG tablet, Take 600 mg by mouth., Disp: , Rfl:    JARDIANCE 25 MG TABS tablet, Take 25 mg by mouth daily., Disp: , Rfl:    metFORMIN (GLUCOPHAGE-XR) 500 MG 24 hr tablet, Take 1,000 mg by mouth 2 (two) times daily., Disp: , Rfl:    nitroGLYCERIN (NITROSTAT) 0.4 MG SL tablet, TAKE 1 TABLET BY MOUTH AS DIRECTED, Disp: 25 tablet, Rfl: 3   pantoprazole (PROTONIX) 40 MG tablet, Take 40 mg by mouth daily., Disp: , Rfl:     Cardiovascular studies:  EKG 03/13/2022: Sinus rhythm 70 bpm Borderline left atrial enlargement Low voltage  Exercise myoview stress 05/16/2017: 1.  The patient performed treadmill exercise using a Modified Bruce protocol, completing 5 minutes. The patient completed an estimated workload of 5.9 METS, reaching 93% of the maximum predicted heart rate. Low exercise capacity. Stress symptoms included leg fatigue. Resting hypertension 154/92 mmHg with normal hemodynamic response with peak BP 174/80 mmHg. No ischemic changes seen on stress electrocardiogram. 2. The overall quality of the study is excellent. There is no evidence of abnormal lung activity. Stress and rest SPECT images demonstrate homogeneous tracer distribution throughout the myocardium. Gated SPECT imaging reveals normal myocardial thickening and wall motion. The left ventricular ejection fraction was normal (51%). 3. Low risk study.   Recent labs: 05/15/2022: Glucose 146, BUN/Cr 14/1.1. EGFR 69.K 4.1. Hb 13.8 HbA1C 6.7% Chol 124, TG 137, HDL 41, LDL 59   Review of Systems  Cardiovascular:  Negative for chest pain, dyspnea on exertion, leg swelling, palpitations and syncope.  Neurological:  Positive for numbness (Great toe numbness).       Objective:    Vitals:   06/13/22 0829  BP: 124/76  Pulse: 90  SpO2: 96%      Physical Exam Vitals and nursing note reviewed.  Constitutional:      General: He is not in acute distress.    Appearance: He is well-developed.  Neck:  Vascular: No JVD.  Cardiovascular:     Rate and Rhythm: Normal rate and regular rhythm.     Pulses: Normal pulses and intact distal pulses.     Heart sounds: Normal heart sounds. No murmur heard. Pulmonary:     Effort: Pulmonary effort is normal.     Breath sounds: Normal breath sounds. No wheezing or rales.  Musculoskeletal:     Right lower leg: No edema.     Left lower leg: No edema.     Comments: Clubbing in fingers of both hands    Visit diagnoses:   ICD-10-CM   1. Essential hypertension  I10     2. Mixed hyperlipidemia  E78.2     3. Cigarette nicotine dependence without complication   F17.210      Meds ordered this encounter  Medications   atorvastatin (LIPITOR) 20 MG tablet    Sig: Take 1 tablet (20 mg total) by mouth daily.    Dispense:  90 tablet    Refill:  3        Assessment & Recommendations:   72 y/o [place] American male with hypertension, type 2 DM,  hyperlipidemia, former smoker, h/o prostate cancer s/p prostatectomy, atypical chest pain  Hypertension: Well controlled  Mixed hyperilipdemia: Chol 124, TG 137, HDL 41, LDL 59 (05/2022). Continue Lipitor 20 mg daily.  Type 2 DM: Well-controlled. Continue follow up with PCP. Continue lipitor.  In absence of bleeding, okay to continue aspirin for primary prevention given his multiple risk factors. He has had colonoscopy within the past 1 year, which was reportedly normal. F/u w/PCP re: DM management and possible neuropathy  Nicotine dependence: Currently smokes 3-5 cigarettes. Encouraged to use nicotine gum.  F/u in 1 year   Elder Negus, MD Pager: [phone] Office: [phone]

## 2022-12-18 DIAGNOSIS — Z0000 Encounter for general adult medical examination without abnormal findings: Secondary | ICD-10-CM | POA: Diagnosis not present

## 2022-12-18 DIAGNOSIS — I700 Atherosclerosis of aorta: Secondary | ICD-10-CM | POA: Diagnosis not present

## 2022-12-18 DIAGNOSIS — F17210 Nicotine dependence, cigarettes, uncomplicated: Secondary | ICD-10-CM | POA: Diagnosis not present

## 2022-12-18 DIAGNOSIS — E785 Hyperlipidemia, unspecified: Secondary | ICD-10-CM | POA: Diagnosis not present

## 2022-12-18 DIAGNOSIS — E1169 Type 2 diabetes mellitus with other specified complication: Secondary | ICD-10-CM | POA: Diagnosis not present

## 2022-12-18 DIAGNOSIS — E119 Type 2 diabetes mellitus without complications: Secondary | ICD-10-CM | POA: Diagnosis not present

## 2023-03-05 DIAGNOSIS — S93401A Sprain of unspecified ligament of right ankle, initial encounter: Secondary | ICD-10-CM | POA: Diagnosis not present

## 2023-04-09 DIAGNOSIS — M25571 Pain in right ankle and joints of right foot: Secondary | ICD-10-CM | POA: Diagnosis not present

## 2023-04-22 DIAGNOSIS — M25571 Pain in right ankle and joints of right foot: Secondary | ICD-10-CM | POA: Diagnosis not present

## 2023-05-19 DIAGNOSIS — R079 Chest pain, unspecified: Secondary | ICD-10-CM | POA: Diagnosis not present

## 2023-05-19 DIAGNOSIS — R42 Dizziness and giddiness: Secondary | ICD-10-CM | POA: Diagnosis not present

## 2023-05-19 DIAGNOSIS — M79603 Pain in arm, unspecified: Secondary | ICD-10-CM | POA: Diagnosis not present

## 2023-05-20 ENCOUNTER — Other Ambulatory Visit: Payer: Self-pay

## 2023-05-20 ENCOUNTER — Emergency Department (HOSPITAL_COMMUNITY)
Admission: EM | Admit: 2023-05-20 | Discharge: 2023-05-20 | Disposition: A | Discharge status: Home / Self Care | Attending: Emergency Medicine | Admitting: Emergency Medicine

## 2023-05-20 ENCOUNTER — Encounter (HOSPITAL_COMMUNITY): Payer: Self-pay | Admitting: Pharmacy Technician

## 2023-05-20 DIAGNOSIS — Z8546 Personal history of malignant neoplasm of prostate: Secondary | ICD-10-CM | POA: Insufficient documentation

## 2023-05-20 DIAGNOSIS — R42 Dizziness and giddiness: Secondary | ICD-10-CM | POA: Diagnosis present

## 2023-05-20 DIAGNOSIS — E119 Type 2 diabetes mellitus without complications: Secondary | ICD-10-CM | POA: Insufficient documentation

## 2023-05-20 DIAGNOSIS — R55 Syncope and collapse: Secondary | ICD-10-CM | POA: Diagnosis not present

## 2023-05-20 DIAGNOSIS — Z7984 Long term (current) use of oral hypoglycemic drugs: Secondary | ICD-10-CM | POA: Insufficient documentation

## 2023-05-20 DIAGNOSIS — I10 Essential (primary) hypertension: Secondary | ICD-10-CM | POA: Insufficient documentation

## 2023-05-20 DIAGNOSIS — Z7982 Long term (current) use of aspirin: Secondary | ICD-10-CM | POA: Diagnosis not present

## 2023-05-20 DIAGNOSIS — Z91040 Latex allergy status: Secondary | ICD-10-CM | POA: Diagnosis not present

## 2023-05-20 DIAGNOSIS — Z79899 Other long term (current) drug therapy: Secondary | ICD-10-CM | POA: Insufficient documentation

## 2023-05-20 LAB — CBC
HCT: 41.8 % (ref 39.0–52.0)
Hemoglobin: 13.4 g/dL (ref 13.0–17.0)
MCH: 27.7 pg (ref 26.0–34.0)
MCHC: 32.1 g/dL (ref 30.0–36.0)
MCV: 86.4 fL (ref 80.0–100.0)
Platelets: 273 10*3/uL (ref 150–400)
RBC: 4.84 MIL/uL (ref 4.22–5.81)
RDW: 13.4 % (ref 11.5–15.5)
WBC: 8.6 10*3/uL (ref 4.0–10.5)
nRBC: 0 % (ref 0.0–0.2)

## 2023-05-20 LAB — COMPREHENSIVE METABOLIC PANEL WITH GFR
ALT: 58 U/L — ABNORMAL HIGH (ref 0–44)
AST: 57 U/L — ABNORMAL HIGH (ref 15–41)
Albumin: 4.1 g/dL (ref 3.5–5.0)
Alkaline Phosphatase: 51 U/L (ref 38–126)
Anion gap: 13 (ref 5–15)
BUN: 11 mg/dL (ref 8–23)
CO2: 22 mmol/L (ref 22–32)
Calcium: 9.6 mg/dL (ref 8.9–10.3)
Chloride: 104 mmol/L (ref 98–111)
Creatinine, Ser: 1.18 mg/dL (ref 0.61–1.24)
GFR, Estimated: >60 mL/min (ref >60)
Glucose, Bld: 86 mg/dL (ref 70–99)
Potassium: 3.7 mmol/L (ref 3.5–5.1)
Sodium: 139 mmol/L (ref 135–145)
Total Bilirubin: 0.4 mg/dL (ref 0.0–1.2)
Total Protein: 7.4 g/dL (ref 6.5–8.1)

## 2023-05-20 LAB — URINALYSIS, ROUTINE W REFLEX MICROSCOPIC
Bilirubin Urine: NEGATIVE
Glucose, UA: >=500 mg/dL — AB
Hgb urine dipstick: NEGATIVE
Ketones, ur: 5 mg/dL — AB
Leukocytes,Ua: NEGATIVE
Nitrite: NEGATIVE
Protein, ur: NEGATIVE mg/dL
Specific Gravity, Urine: 1.036 — ABNORMAL HIGH (ref 1.005–1.030)
pH: 5 (ref 5.0–8.0)

## 2023-05-20 LAB — TROPONIN I (HIGH SENSITIVITY)
Troponin I (High Sensitivity): 6 ng/L (ref <18)
Troponin I (High Sensitivity): 6 ng/L (ref <18)

## 2023-05-20 LAB — D-DIMER, QUANTITATIVE: D-Dimer, Quant: <0.27 ug{FEU}/mL (ref 0.00–0.50)

## 2023-05-20 NOTE — Discharge Instructions (Signed)
 You were seen in the emerged department for lightheadedness Your blood work EKG and urine test all looked okay We do not think that is related to your heart at this time It is important that you follow-up with your primary care doctor for reevaluation in the next week Return to the emergency department for chest pain trouble breathing or any other concerns

## 2023-05-20 NOTE — ED Triage Notes (Signed)
 Pt here with reports of feeling light headed for the last week. Lightheaded feeling comes and goes. Intermittent L arm pain.

## 2023-05-20 NOTE — ED Notes (Signed)
 Pt understood d/c instructions and when to return to the ED. Pt left w/ family to go home

## 2023-05-20 NOTE — ED Provider Notes (Signed)
 [place] EMERGENCY DEPARTMENT AT [facility] Provider Note   CSN: [id number] Arrival date & time: 05/20/23  1003     History  Chief Complaint  Patient presents with   Near Syncope    [name] is a 73 y.o. male.  With a history of diabetes, hypertension, hyperlipidemia and prostate cancer who presents to the ED for lightheadedness.  1 week of ongoing lightheadedness that seems to come and go.  Unable to identify any modifying factors.  Has also had some left upper extremity pain.  No chest pain shortness of breath nausea vomiting fevers or chills.  No recent trauma or falls   Near Syncope       Home Medications Prior to Admission medications   Medication Sig Start Date End Date Taking? Authorizing Provider  ALPRAZolam (XANAX) 0.5 MG tablet Take 0.5 mg by mouth 2 (two) times daily as needed for anxiety.    [provider]  aspirin 81 MG tablet Take 81 mg by mouth daily.    [provider]  atorvastatin  (LIPITOR) 20 MG tablet Take 1 tablet (20 mg total) by mouth daily. 06/13/22   Patwardhan, Manish J, MD  buPROPion (WELLBUTRIN XL) 150 MG 24 hr tablet TAKE 1 TABLET BY MOUTH EVERY DAY 04/21/18   Patwardhan, Manish J, MD  cetirizine (ZYRTEC) 10 MG tablet Take 10 mg by mouth daily.    [provider]  enalapril (VASOTEC) 2.5 MG tablet Take 2.5 mg by mouth daily.    [provider]  fluticasone Odis Bennetts) 50 MCG/ACT nasal spray 1-2 sprays 03/16/19   [provider]  ibuprofen (ADVIL) 600 MG tablet Take 600 mg by mouth. 01/25/21   [provider]  JARDIANCE 25 MG TABS tablet Take 25 mg by mouth daily. 03/02/21   [provider]  metFORMIN (GLUCOPHAGE-XR) 500 MG 24 hr tablet Take 1,000 mg by mouth 2 (two) times daily. 02/22/19   [provider]  nitroGLYCERIN (NITROSTAT) 0.4 MG SL tablet TAKE 1 TABLET BY MOUTH AS DIRECTED 12/14/18   Patwardhan, Manish J, MD  pantoprazole (PROTONIX) 40 MG tablet Take 40 mg by  mouth daily.    [provider]      Allergies    Latex    Review of Systems   Review of Systems  Cardiovascular:  Positive for near-syncope.    Physical Exam Updated Vital Signs BP 116/79 (BP Location: Right Arm)   Pulse 72   Temp 98.4 F (36.9 C) (Oral)   Resp 20   SpO2 99%  Physical Exam Vitals and nursing note reviewed.  HENT:     Head: Normocephalic and atraumatic.  Eyes:     Pupils: Pupils are equal, round, and reactive to light.  Cardiovascular:     Rate and Rhythm: Normal rate and regular rhythm.  Pulmonary:     Effort: Pulmonary effort is normal.     Breath sounds: Normal breath sounds.  Abdominal:     Palpations: Abdomen is soft.     Tenderness: There is no abdominal tenderness.  Skin:    General: Skin is warm and dry.  Neurological:     General: No focal deficit present.     Mental Status: He is alert and oriented to person, place, and time.     Sensory: No sensory deficit.     Motor: No weakness.  Psychiatric:        Mood and Affect: Mood normal.     ED Results / Procedures / Treatments  Labs (all labs ordered are listed, but only abnormal results are displayed) Labs Reviewed  COMPREHENSIVE METABOLIC PANEL WITH GFR - Abnormal; Notable for the following components:      Result Value   AST 57 (*)    ALT 58 (*)    All other components within normal limits  URINALYSIS, ROUTINE W REFLEX MICROSCOPIC - Abnormal; Notable for the following components:   Specific Gravity, Urine 1.036 (*)    Glucose, UA >=500 (*)    Ketones, ur 5 (*)    Bacteria, UA RARE (*)    All other components within normal limits  CBC  D-DIMER, QUANTITATIVE  CBG MONITORING, ED  TROPONIN I (HIGH SENSITIVITY)  TROPONIN I (HIGH SENSITIVITY)    EKG EKG Interpretation Date/Time:  Tuesday May 20 2023 10:14:52 EDT Ventricular Rate:  118 PR Interval:  130 QRS Duration:  76 QT Interval:  332 QTC Calculation: 465 R Axis:   55  Text Interpretation: Sinus tachycardia  Low voltage QRS Borderline ECG When compared with ECG of 17-Apr-2017 10:16, PREVIOUS ECG IS PRESENT Confirmed by Rafael Bun [phone]) on 05/20/2023 12:09:39 PM  Radiology No results found.  Procedures Procedures    Medications Ordered in ED Medications - No data to display  ED Course/ Medical Decision Making/ A&P Clinical Course as of 05/20/23 1646  Tue May 20, 2023  1644 No UTI.  No leukocytosis or anemia.  No significant electrolyte imbalance.  High sensitive troponin and delta troponin not consistent with ACS.  No evidence of D-dimer low suspicion for PE.  Reevaluated patient.  He is sitting on the edge of bed and states he wants to leave and go home and eat.  No lightheadedness.  Has remained stable here.  Appropriate for discharge with PCP follow-up.  Return precautions discussed with him and his daughter at bedside in detail [MP]    Clinical Course User Index [MP] Sallyanne Creamer, DO                                 Medical Decision Making 73 year old male with history of above presenting for lightheadedness x 1 week.  Episodic lightheadedness.  Also reports left upper extremity pain that seems to come and go.  Afebrile hemodynamically stable.  Benign neurologic exam and physical exam.  Will obtain laboratory workup to evaluate for anemia, electrolyte balance or leukocytosis.  Will also evaluate for ACS with activity troponin EKG.  History of cancer with reported lightheadedness and some unusual atraumatic left upper extremity pain.  Will obtain D-dimer to evaluate for potential PE and CTA if D-dimer significantly elevated.  Will also obtain urinalysis to look for UTI as cause of ongoing lightheadedness  Amount and/or Complexity of Data Reviewed Labs: ordered.           Final Clinical Impression(s) / ED Diagnoses Final diagnoses:  Syncope, unspecified syncope type    Rx / DC Orders ED Discharge Orders     None         Sallyanne Creamer, DO 05/20/23 1646

## 2023-06-13 ENCOUNTER — Ambulatory Visit: Payer: Self-pay | Admitting: Cardiology

## 2023-06-18 DIAGNOSIS — E1169 Type 2 diabetes mellitus with other specified complication: Secondary | ICD-10-CM | POA: Diagnosis not present

## 2023-06-18 DIAGNOSIS — E785 Hyperlipidemia, unspecified: Secondary | ICD-10-CM | POA: Diagnosis not present

## 2023-06-18 DIAGNOSIS — I700 Atherosclerosis of aorta: Secondary | ICD-10-CM | POA: Diagnosis not present

## 2023-06-18 DIAGNOSIS — F17210 Nicotine dependence, cigarettes, uncomplicated: Secondary | ICD-10-CM | POA: Diagnosis not present

## 2023-06-18 DIAGNOSIS — K219 Gastro-esophageal reflux disease without esophagitis: Secondary | ICD-10-CM | POA: Diagnosis not present

## 2023-07-02 ENCOUNTER — Telehealth: Payer: Self-pay | Admitting: Cardiology

## 2023-07-02 NOTE — Telephone Encounter (Signed)
 Pt wants to do a provider switch from Dr. Elmira to Dr. Raford. Please advise

## 2023-07-02 NOTE — Telephone Encounter (Signed)
 Will miss seeing him but okay with me.  Thanks MJP

## 2023-07-16 DIAGNOSIS — E119 Type 2 diabetes mellitus without complications: Secondary | ICD-10-CM | POA: Diagnosis not present

## 2023-09-09 ENCOUNTER — Other Ambulatory Visit: Payer: Self-pay | Admitting: Cardiology

## 2023-09-10 ENCOUNTER — Other Ambulatory Visit: Payer: Self-pay

## 2023-09-10 MED ORDER — ATORVASTATIN CALCIUM 20 MG PO TABS: 20.0000 mg | ORAL_TABLET | Freq: Every day | ORAL | 1 refills | Status: DC

## 2023-09-11 MED ORDER — ATORVASTATIN CALCIUM 20 MG PO TABS: 20.0000 mg | ORAL_TABLET | Freq: Every day | ORAL | 0 refills | Status: AC

## 2023-12-30 ENCOUNTER — Encounter (HOSPITAL_BASED_OUTPATIENT_CLINIC_OR_DEPARTMENT_OTHER): Payer: Self-pay | Admitting: Cardiovascular Disease

## 2023-12-30 ENCOUNTER — Ambulatory Visit (HOSPITAL_BASED_OUTPATIENT_CLINIC_OR_DEPARTMENT_OTHER): Admitting: Cardiovascular Disease

## 2023-12-30 DIAGNOSIS — E782 Mixed hyperlipidemia: Secondary | ICD-10-CM

## 2023-12-30 DIAGNOSIS — I10 Essential (primary) hypertension: Secondary | ICD-10-CM | POA: Diagnosis not present

## 2023-12-30 DIAGNOSIS — Z720 Tobacco use: Secondary | ICD-10-CM | POA: Diagnosis not present

## 2023-12-30 DIAGNOSIS — I700 Atherosclerosis of aorta: Secondary | ICD-10-CM | POA: Insufficient documentation

## 2023-12-30 NOTE — Progress Notes (Signed)
 " Cardiology Office Note:  .    Date:  12/30/2023  ID:  [name], DOB 04/09/1950, MRN [id number] PCP: Sun, Vyvyan, MD  [facility] Providers Cardiologist:  Annabella Scarce, MD    History of Present Illness: .   [name] is a 73 y.o. male with hypertension, diabetes, tobacco abuse, diabetes and hyperlipidemia here for follow up.  He previously saw Dr. Elmira for atypical chest/back pain.,  He had a stress test that was negative 05/2017.  He last saw Dr. Elmira 06/2022 and was doing well.    Discussed the use of AI scribe software for clinical note transcription with the patient, who gave verbal consent to proceed.  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 The shortness of breath resolves with rest. No chest pain, pressure, or swelling in the legs or feet. He does not experience shortness of breath while lying down.  He has a family history of heart disease, with a son who suffered a massive heart attack at the age of 37, which has heightened his concern about his own heart health.  He has been diagnosed with diabetes and is currently on metformin and Jardiance. He reports weight gain since starting these medications, with an increase from a 29-inch waist to a 38-inch waist. His A1c is 7.2. He consumes a diet high in sweets and starches and often feels hungry after meals prepared by his diabetic wife.  He smokes about 6-7 cigarettes a day, more when working, and has attempted to quit using various methods, including nicotine  gum and coaching, without success.  His cholesterol levels were checked in June, showing a total cholesterol of 126, HDL of 44, LDL of 52, and triglycerides of 185. He does not regularly check his blood pressure at home despite having a device for it.  He carries nitroglycerin but has not used it. He  occasionally uses ibuprofen for pain, particularly related to a sprained ankle and possible arthritis, for which he applies a heating cream.  ROS:  As per HPI  Studies Reviewed: .       N/a  Risk Assessment/Calculations:             Physical Exam:   VS:  BP 122/80   Pulse 85   Ht 5' 7 (1.702 m)   Wt 191 lb (86.6 kg)   SpO2 97%   BMI 29.91 kg/m  , BMI Body mass index is 29.91 kg/m. GENERAL:  Well appearing HEENT: Pupils equal round and reactive, fundi not visualized, oral mucosa unremarkable NECK:  No jugular venous distention, waveform within normal limits, carotid upstroke brisk and symmetric, no bruits, no thyromegaly LUNGS:  Clear to auscultation bilaterally HEART:  RRR.  PMI not displaced or sustained,S1 and S2 within normal limits, no S3, no S4, no clicks, no rubs, no murmurs ABD:  Flat, positive bowel sounds normal in frequency in pitch, no bruits, no rebound, no guarding, no midline pulsatile mass, no hepatomegaly, no splenomegaly EXT:  2 plus pulses throughout, no edema, no cyanosis no clubbing SKIN:  No rashes no nodules NEURO:  Cranial nerves II through XII grossly intact, motor grossly intact throughout PSYCH:  Cognitively intact, oriented to person place and time   ASSESSMENT AND PLAN: .    Assessment & Plan # Type 2 diabetes mellitus A1c of 7.2%. Current treatment includes metformin and Jardiance. Consideration of  Ozempic for better diabetes control and weight management, which may also reduce cardiovascular risk. -He plans to discuss with new PCP about switching to Ozempic for diabetes management and weight control.  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 Currently on atorvastatin  and aspirin. Previous stress test was low risk. - Continue atorvastatin  and aspirin. - Discontinued nitroglycerin.  He has no known obstructive CAD and no ischemic symptoms.   # Hypertriglyceridemia Triglycerides at 185 mg/dL. Discussed dietary  modifications and increase in exercise to manage triglyceride levels. - Implement dietary changes to reduce carbohydrate intake.  # Nicotine  dependence, cigarettes Nicotine  dependence with current smoking of approximately 10 cigarettes per day. Previous attempts to quit using gum and coaching were unsuccessful. Discussed the use of nicotine  gum as a cessation aid. - Consider nicotine  gum starting at 7 mg for smoking cessation. - Provided resources and coaching for smoking cessation.  #Overweight Status with recent weight gain. Consideration of Ozempic for weight loss and diabetes management. - Implement dietary changes to reduce carbohydrate intake. - Increase physical activity to at least 150 minutes per week. - Discuss with new doctor about starting Ozempic for weight management.    Dispo: f/u 1 year  Signed, Annabella Scarce, MD   "

## 2023-12-30 NOTE — Patient Instructions (Signed)
 Medication Instructions:  Your physician recommends that you continue on your current medications as directed. Please refer to the Current Medication list given to you today.   *If you need a refill on your cardiac medications before your next appointment, please call your pharmacy*  Lab Work: NONE  Testing/Procedures: NONE  Follow-Up: At [facility], you and your health needs are our priority.  As part of our continuing mission to provide you with exceptional heart care, our providers are all part of one team.  This team includes your primary Cardiologist (physician) and Advanced Practice Providers or APPs (Physician Assistants and Nurse Practitioners) who all work together to provide you with the care you need, when you need it.  Your next appointment:   12 month(s)  Provider:   Chilton Si, MD, Eligha Bridegroom, NP, or Gillian Shields, NP    We recommend signing up for the patient portal called "MyChart".  Sign up information is provided on this After Visit Summary.  MyChart is used to connect with patients for Virtual Visits (Telemedicine).  Patients are able to view lab/test results, encounter notes, upcoming appointments, etc.  Non-urgent messages can be sent to your provider as well.   To learn more about what you can do with MyChart, go to [email or web address].
